# Patient Record
Sex: Male | Born: 1960
Health system: Southern US, Community
[De-identification: ages and names within clinical notes are randomized; demographics above are authoritative.]

## PROBLEM LIST (undated history)

## (undated) DIAGNOSIS — I219 Acute myocardial infarction, unspecified: Secondary | ICD-10-CM

## (undated) DIAGNOSIS — Z87442 Personal history of urinary calculi: Secondary | ICD-10-CM

## (undated) DIAGNOSIS — I255 Ischemic cardiomyopathy: Secondary | ICD-10-CM

## (undated) DIAGNOSIS — E119 Type 2 diabetes mellitus without complications: Secondary | ICD-10-CM

## (undated) DIAGNOSIS — I2510 Atherosclerotic heart disease of native coronary artery without angina pectoris: Secondary | ICD-10-CM

## (undated) DIAGNOSIS — I5022 Chronic systolic (congestive) heart failure: Secondary | ICD-10-CM

## (undated) DIAGNOSIS — E039 Hypothyroidism, unspecified: Secondary | ICD-10-CM

## (undated) DIAGNOSIS — R7989 Other specified abnormal findings of blood chemistry: Secondary | ICD-10-CM

## (undated) DIAGNOSIS — I493 Ventricular premature depolarization: Secondary | ICD-10-CM

## (undated) DIAGNOSIS — R32 Unspecified urinary incontinence: Secondary | ICD-10-CM

## (undated) DIAGNOSIS — I272 Pulmonary hypertension, unspecified: Secondary | ICD-10-CM

## (undated) DIAGNOSIS — I4892 Unspecified atrial flutter: Secondary | ICD-10-CM

## (undated) DIAGNOSIS — Z91148 Patient's other noncompliance with medication regimen for other reason: Secondary | ICD-10-CM

## (undated) DIAGNOSIS — I472 Ventricular tachycardia: Secondary | ICD-10-CM

## (undated) DIAGNOSIS — R945 Abnormal results of liver function studies: Secondary | ICD-10-CM

## (undated) DIAGNOSIS — Z720 Tobacco use: Secondary | ICD-10-CM

## (undated) DIAGNOSIS — I340 Nonrheumatic mitral (valve) insufficiency: Secondary | ICD-10-CM

## (undated) DIAGNOSIS — Z9114 Patient's other noncompliance with medication regimen: Secondary | ICD-10-CM

## (undated) DIAGNOSIS — E785 Hyperlipidemia, unspecified: Secondary | ICD-10-CM

## (undated) DIAGNOSIS — Z955 Presence of coronary angioplasty implant and graft: Secondary | ICD-10-CM

## (undated) DIAGNOSIS — E079 Disorder of thyroid, unspecified: Secondary | ICD-10-CM

## (undated) DIAGNOSIS — I4729 Other ventricular tachycardia: Secondary | ICD-10-CM

## (undated) DIAGNOSIS — M5416 Radiculopathy, lumbar region: Secondary | ICD-10-CM

## (undated) DIAGNOSIS — I10 Essential (primary) hypertension: Secondary | ICD-10-CM

## (undated) DIAGNOSIS — R0683 Snoring: Secondary | ICD-10-CM

## (undated) DIAGNOSIS — F329 Major depressive disorder, single episode, unspecified: Secondary | ICD-10-CM

## (undated) HISTORY — DX: Major depressive disorder, single episode, unspecified: F32.9

## (undated) HISTORY — DX: Unspecified urinary incontinence: R32

## (undated) HISTORY — DX: Hyperlipidemia, unspecified: E78.5

## (undated) HISTORY — PX: ANTERIOR CRUCIATE LIGAMENT REPAIR: SHX115

## (undated) HISTORY — DX: Ventricular premature depolarization: I49.3

## (undated) HISTORY — DX: Radiculopathy, lumbar region: M54.16

## (undated) HISTORY — DX: Ischemic cardiomyopathy: I25.5

## (undated) HISTORY — DX: Essential (primary) hypertension: I10

## (undated) HISTORY — DX: Acute myocardial infarction, unspecified: I21.9

## (undated) HISTORY — DX: Type 2 diabetes mellitus without complications: E11.9

## (undated) HISTORY — DX: Presence of coronary angioplasty implant and graft: Z95.5

## (undated) HISTORY — DX: Hypothyroidism, unspecified: E03.9

## (undated) HISTORY — DX: Atherosclerotic heart disease of native coronary artery without angina pectoris: I25.10

## (undated) HISTORY — DX: Disorder of thyroid, unspecified: E07.9

## (undated) HISTORY — DX: Tobacco use: Z72.0

---

## 1996-02-22 HISTORY — PX: KNEE SURGERY: SHX244

## 2009-02-21 HISTORY — PX: CORONARY STENT PLACEMENT: SHX1402

## 2012-06-26 ENCOUNTER — Encounter: Payer: Self-pay | Admitting: Family Medicine

## 2012-06-26 ENCOUNTER — Ambulatory Visit (INDEPENDENT_AMBULATORY_CARE_PROVIDER_SITE_OTHER): Payer: Worker's Compensation | Admitting: Family Medicine

## 2012-06-26 VITALS — BP 128/82 | HR 80 | Ht 72.0 in | Wt 242.0 lb

## 2012-06-26 DIAGNOSIS — R200 Anesthesia of skin: Secondary | ICD-10-CM

## 2012-06-26 DIAGNOSIS — R32 Unspecified urinary incontinence: Secondary | ICD-10-CM

## 2012-06-26 DIAGNOSIS — M5416 Radiculopathy, lumbar region: Secondary | ICD-10-CM | POA: Insufficient documentation

## 2012-06-26 DIAGNOSIS — IMO0002 Reserved for concepts with insufficient information to code with codable children: Secondary | ICD-10-CM

## 2012-06-26 DIAGNOSIS — Z955 Presence of coronary angioplasty implant and graft: Secondary | ICD-10-CM

## 2012-06-26 DIAGNOSIS — Z8679 Personal history of other diseases of the circulatory system: Secondary | ICD-10-CM | POA: Insufficient documentation

## 2012-06-26 DIAGNOSIS — R209 Unspecified disturbances of skin sensation: Secondary | ICD-10-CM

## 2012-06-26 HISTORY — DX: Presence of coronary angioplasty implant and graft: Z95.5

## 2012-06-26 HISTORY — DX: Radiculopathy, lumbar region: M54.16

## 2012-06-26 HISTORY — DX: Unspecified urinary incontinence: R32

## 2012-06-26 MED ORDER — OXYCODONE-ACETAMINOPHEN 10-325 MG PO TABS: 1.0000 | ORAL_TABLET | Freq: Three times a day (TID) | ORAL | Status: AC | PRN

## 2012-06-26 MED ORDER — PREDNISONE 20 MG PO TABS: ORAL_TABLET | ORAL | Status: AC

## 2012-06-26 MED ORDER — GABAPENTIN 600 MG PO TABS: 600.0000 mg | ORAL_TABLET | Freq: Three times a day (TID) | ORAL | Status: DC
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
## 2012-07-03 ENCOUNTER — Encounter: Payer: Self-pay | Admitting: *Deleted

## 2012-07-05 ENCOUNTER — Ambulatory Visit: Payer: Self-pay | Admitting: Physical Therapy

## 2012-07-10 ENCOUNTER — Ambulatory Visit: Payer: Self-pay | Admitting: Family Medicine

## 2012-07-10 DIAGNOSIS — Z0289 Encounter for other administrative examinations: Secondary | ICD-10-CM

## 2012-07-11 ENCOUNTER — Ambulatory Visit: Payer: Self-pay | Admitting: Physical Therapy

## 2013-05-07 DIAGNOSIS — I2510 Atherosclerotic heart disease of native coronary artery without angina pectoris: Secondary | ICD-10-CM

## 2013-05-07 DIAGNOSIS — I255 Ischemic cardiomyopathy: Secondary | ICD-10-CM | POA: Insufficient documentation

## 2013-05-09 ENCOUNTER — Encounter: Payer: Self-pay | Admitting: Family Medicine

## 2013-06-05 ENCOUNTER — Encounter: Payer: Self-pay | Admitting: Family Medicine

## 2013-06-05 DIAGNOSIS — I255 Ischemic cardiomyopathy: Secondary | ICD-10-CM

## 2013-06-05 HISTORY — DX: Ischemic cardiomyopathy: I25.5

## 2013-06-07 ENCOUNTER — Other Ambulatory Visit: Payer: Self-pay | Admitting: Family Medicine

## 2013-06-07 ENCOUNTER — Encounter: Payer: Self-pay | Admitting: Family Medicine

## 2013-07-10 ENCOUNTER — Encounter: Payer: Self-pay | Admitting: Family Medicine

## 2013-07-10 ENCOUNTER — Ambulatory Visit (INDEPENDENT_AMBULATORY_CARE_PROVIDER_SITE_OTHER): Payer: BC Managed Care – PPO | Admitting: Family Medicine

## 2013-07-10 VITALS — BP 127/77 | HR 85 | Temp 98.3°F | Wt 264.0 lb

## 2013-07-10 DIAGNOSIS — M5416 Radiculopathy, lumbar region: Secondary | ICD-10-CM

## 2013-07-10 DIAGNOSIS — J189 Pneumonia, unspecified organism: Secondary | ICD-10-CM

## 2013-07-10 DIAGNOSIS — IMO0002 Reserved for concepts with insufficient information to code with codable children: Secondary | ICD-10-CM

## 2013-07-10 MED ORDER — LEVOFLOXACIN 500 MG PO TABS: 500.0000 mg | ORAL_TABLET | Freq: Every day | ORAL | Status: DC
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
## 2013-07-22 ENCOUNTER — Telehealth: Payer: Self-pay | Admitting: Family Medicine

## 2013-07-22 NOTE — Telephone Encounter (Signed)
Angie from [facility] called to see why patient has been referred to Neuro since in your notes you stated that patients Workers Comp has denied patient from being a good candidate for Neuro Surgery.

## 2013-07-23 ENCOUNTER — Encounter: Payer: Self-pay | Admitting: Family Medicine

## 2013-07-23 NOTE — Telephone Encounter (Signed)
Patient wants a second opinion and wants to take on the matter using his commercial insurance.

## 2013-07-23 NOTE — Telephone Encounter (Signed)
Left message on Angies vm

## 2013-07-29 ENCOUNTER — Telehealth: Payer: Self-pay | Admitting: Family Medicine

## 2013-07-29 ENCOUNTER — Telehealth: Payer: Self-pay | Admitting: *Deleted

## 2013-07-29 DIAGNOSIS — M5416 Radiculopathy, lumbar region: Secondary | ICD-10-CM

## 2013-07-29 NOTE — Telephone Encounter (Signed)
Pt states he is going to hold off on getting the MRI for right now per the advice of his lawyer

## 2013-07-29 NOTE — Telephone Encounter (Signed)
Sue Lush, Will you please let patient know that [facility] have requested an up to date MRI prior to considering him as a referral.  They want an MRI within the last 12 months so I've ordered one through our system to be obtained at [facility].  Please contact me if not contacted about scheduling this, we'll re-submit the referral once the MRI is available.

## 2013-07-29 NOTE — Telephone Encounter (Signed)
Pa obtained for MRI Lumbar w/o contrast. auth # [id number]. Exp 08/27/13.  Meyer Cory, LPN

## 2014-03-03 ENCOUNTER — Ambulatory Visit (INDEPENDENT_AMBULATORY_CARE_PROVIDER_SITE_OTHER): Payer: BLUE CROSS/BLUE SHIELD | Admitting: Family Medicine

## 2014-03-03 ENCOUNTER — Encounter: Payer: Self-pay | Admitting: Family Medicine

## 2014-03-03 VITALS — BP 131/94 | HR 76 | Wt 257.0 lb

## 2014-03-03 DIAGNOSIS — I255 Ischemic cardiomyopathy: Secondary | ICD-10-CM

## 2014-03-03 DIAGNOSIS — M5416 Radiculopathy, lumbar region: Secondary | ICD-10-CM | POA: Diagnosis not present

## 2014-03-03 DIAGNOSIS — I10 Essential (primary) hypertension: Secondary | ICD-10-CM

## 2014-03-03 MED ORDER — LISINOPRIL 5 MG PO TABS: 5.0000 mg | ORAL_TABLET | Freq: Every day | ORAL | Status: DC

## 2014-03-03 MED ORDER — PRAVASTATIN SODIUM 20 MG PO TABS
20.0000 mg | ORAL_TABLET | Freq: Every day | ORAL | Status: DC
Start: 2014-03-03 — End: 2014-03-12

## 2014-03-03 MED ORDER — CARVEDILOL 12.5 MG PO TABS: 12.5000 mg | ORAL_TABLET | Freq: Two times a day (BID) | ORAL | Status: DC

## 2014-03-03 MED ORDER — TOPIRAMATE 100 MG PO TABS: 100.0000 mg | ORAL_TABLET | Freq: Two times a day (BID) | ORAL | Status: DC

## 2014-03-03 MED ORDER — MELOXICAM 7.5 MG PO TABS: 7.5000 mg | ORAL_TABLET | Freq: Every day | ORAL | Status: DC

## 2014-03-03 MED ORDER — OXYCODONE HCL 10 MG PO TABS: ORAL_TABLET | ORAL | Status: DC

## 2014-03-03 NOTE — Patient Instructions (Signed)
Contact me if you are not contacted within the next 1 or 2 weeks by a cardiology office to establish care within the [facility] system.   I provided you with a taper regimen for oxycodone. I do not prescribe this medication for treatment of chronic pain in this prescription is only to help you get off of the oxycodone without withdrawal symptoms. If you determine that your pain is uncontrolled and you need to go back on oxycodone I will refer you to a pain management clinic.

## 2014-03-03 NOTE — Progress Notes (Signed)
CC: Randall Jackson is a 54 y.o. male is here for Medication Refill   Subjective: HPI:  Follow-up lumbar radiculopathy:he tells me his neurosurgeon has advised against surgery for what sounds like mostly that he is not a good surgical candidate from a cardiac standpoint. He was seen a pain management clinic who has been managing his oxycodone that he tells me slightly helps with the pain however he wants to get off this medication. He tells me he's been able to "survive" from a pain standpoint, on 30 pills since October 2015. Pain is still described as sharp, midline and left low back that radiates down the left leg.  He continues to get benefit from meloxicam and also Topamax and would like to continue these medicines. He denies any new character severity or frequency changes to his pain since I saw him last.  Follow-up essential hypertension: he's been taking the Cipro 5 mg and carvedilol 12.5 mg on a daily basis since I saw him last. No outside blood pressures to report.  There has been no chest pain shortness of breath orthopnea nor peripheral edema since I saw him last. He wants to know if there is a cardiologist within the [facility] system that he can see.   Review Of Systems Outlined In HPI  Past Medical History  Diagnosis Date  . Heart attack     Past Surgical History  Procedure Laterality Date  . Knee surgery  1998  . Cardiac stents    . Stents placed  2011   Family History  Problem Relation Age of Onset  . Diabetes Mother     father    History   Social History  . Marital Status: Married    Spouse Name: N/A    Number of Children: N/A  . Years of Education: N/A   Occupational History  . Not on file.   Social History Main Topics  . Smoking status: Current Every Day Smoker  . Smokeless tobacco: Not on file  . Alcohol Use: 0.5 oz/week    1 drink(s) per week  . Drug Use: No  . Sexual Activity: Not Currently   Other Topics Concern  . Not on file   Social History  Narrative     Objective: BP 131/94 mmHg  Pulse 76  Wt 257 lb (116.574 kg)  General: Alert and Oriented, No Acute Distress HEENT: Pupils equal, round, reactive to light. Conjunctivae clear.  Moist mucous membranes terms unremarkable Lungs: Clear to auscultation bilaterally, no wheezing/ronchi/rales.  Comfortable work of breathing. Good air movement. Cardiac: Regular rate and rhythm. Normal S1/S2.  No murmurs, rubs, nor gallops.   Extremities: No peripheral edema.  Strong peripheral pulses.  Mental Status: No depression, anxiety, nor agitation. Skin: Warm and dry.  Assessment & Plan: Randall Jackson was seen today for medication refill.  Diagnoses and associated orders for this visit:  Lumbar radiculopathy - topiramate (TOPAMAX) 100 MG tablet; Take 1 tablet (100 mg total) by mouth 2 (two) times daily. - meloxicam (MOBIC) 7.5 MG tablet; Take 1 tablet (7.5 mg total) by mouth daily. - Oxycodone HCl 10 MG TABS; Half tab by mouth twice a day for one two weeks, then half tab by mouth daily for two weeks.  Essential hypertension, benign - Lipid panel - Comp Met (CMET)  Cardiomyopathy, ischemic - Ambulatory referral to Cardiology  Other Orders - carvedilol (COREG) 12.5 MG tablet; Take 1 tablet (12.5 mg total) by mouth 2 (two) times daily with a meal. - lisinopril (PRINIVIL,ZESTRIL) 5  MG tablet; Take 1 tablet (5 mg total) by mouth daily. - pravastatin (PRAVACHOL) 20 MG tablet; Take 1 tablet (20 mg total) by mouth daily.    Lumbar radiculopathy: continue Topamax and mobility. Expressed my support of him stopping oxycodone. I provided him with a taper to help minimize any possibility of withdrawal symptoms. I also let him know that this is not a medication that I would be managing chronically and if he feels the need to continue on oxycodone or any other narcotic I would refer him back to pain management. Essential hypertension: he is just barely in stage I hypertension and I believe  Blood  pressure would improve withfurther sodium restriction therefore no changes to his current carvedilol and the Cipro regimen Ischemic cardiomyopathy: Continue on pravastatin, referral to cardiology has been placed    Return if symptoms worsen or fail to improve.

## 2014-03-11 LAB — COMPREHENSIVE METABOLIC PANEL
ALK PHOS: 62 U/L (ref 39–117)
ALT: 23 U/L (ref 0–53)
AST: 18 U/L (ref 0–37)
Albumin: 4 g/dL (ref 3.5–5.2)
BUN: 14 mg/dL (ref 6–23)
CALCIUM: 9.1 mg/dL (ref 8.4–10.5)
CO2: 22 meq/L (ref 19–32)
CREATININE: 0.78 mg/dL (ref 0.50–1.35)
Chloride: 104 mEq/L (ref 96–112)
Glucose, Bld: 104 mg/dL — ABNORMAL HIGH (ref 70–99)
Potassium: 4.3 mEq/L (ref 3.5–5.3)
Sodium: 135 mEq/L (ref 135–145)
Total Bilirubin: 0.6 mg/dL (ref 0.2–1.2)
Total Protein: 6.8 g/dL (ref 6.0–8.3)

## 2014-03-11 LAB — LIPID PANEL
CHOL/HDL RATIO: 6.8 ratio
Cholesterol: 176 mg/dL (ref 0–200)
HDL: 26 mg/dL — AB (ref >39)
LDL Cholesterol: 96 mg/dL (ref 0–99)
Triglycerides: 268 mg/dL — ABNORMAL HIGH (ref <150)
VLDL: 54 mg/dL — AB (ref 0–40)

## 2014-03-12 ENCOUNTER — Other Ambulatory Visit: Payer: Self-pay | Admitting: Family Medicine

## 2014-03-12 ENCOUNTER — Telehealth: Payer: Self-pay | Admitting: Family Medicine

## 2014-03-12 MED ORDER — PRAVASTATIN SODIUM 40 MG PO TABS: 40.0000 mg | ORAL_TABLET | Freq: Every day | ORAL | Status: DC

## 2014-03-12 MED ORDER — FISH OIL 1000 MG PO CAPS: ORAL_CAPSULE | ORAL | Status: DC

## 2014-03-12 NOTE — Telephone Encounter (Signed)
Randall Jackson, Will you please let patient know that kidney and liver function is normal.  His LDL cholesterol and triglycerides are above goal for protecting his cardiovascular health therefore I'd recommend increasing his pravastatin to 40mg  daily and adding two OTC fish oil capsules, 1g each, to breakfast and dinner.  We'll want to recheck this in 3 months.  I've sent a new Rx to his [facility]

## 2014-03-12 NOTE — Telephone Encounter (Signed)
Spoke to patient gave him lab results as noted below. Chanel Mckesson,CMA

## 2014-04-09 ENCOUNTER — Institutional Professional Consult (permissible substitution): Payer: Self-pay | Admitting: Cardiology

## 2014-04-22 ENCOUNTER — Encounter: Payer: Self-pay | Admitting: Family Medicine

## 2014-04-22 ENCOUNTER — Ambulatory Visit (INDEPENDENT_AMBULATORY_CARE_PROVIDER_SITE_OTHER): Payer: BLUE CROSS/BLUE SHIELD | Admitting: Family Medicine

## 2014-04-22 VITALS — BP 131/89 | HR 78 | Wt 256.0 lb

## 2014-04-22 DIAGNOSIS — M5416 Radiculopathy, lumbar region: Secondary | ICD-10-CM

## 2014-04-22 DIAGNOSIS — F329 Major depressive disorder, single episode, unspecified: Secondary | ICD-10-CM

## 2014-04-22 DIAGNOSIS — F32A Depression, unspecified: Secondary | ICD-10-CM

## 2014-04-22 MED ORDER — DULOXETINE HCL 20 MG PO CPEP: 20.0000 mg | ORAL_CAPSULE | Freq: Every day | ORAL | Status: DC
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
## 2014-08-05 ENCOUNTER — Other Ambulatory Visit: Payer: Self-pay | Admitting: Family Medicine

## 2014-08-05 DIAGNOSIS — M5416 Radiculopathy, lumbar region: Secondary | ICD-10-CM

## 2014-08-05 MED ORDER — TOPIRAMATE 100 MG PO TABS: 100.0000 mg | ORAL_TABLET | Freq: Two times a day (BID) | ORAL | Status: DC

## 2014-08-05 NOTE — Telephone Encounter (Signed)
Rx sent to [facility] on [address].

## 2014-08-06 ENCOUNTER — Telehealth: Payer: Self-pay | Admitting: *Deleted

## 2014-08-06 NOTE — Telephone Encounter (Signed)
Pt called today questioning why he is no longer "allowed" to take the meloxicam.  I read the notes to him from the 3-1 visit where it's stated that it was ineffective.  He then stated that he's been taking it in conjunction with the cymbalta and it's been working for the most part.  He stated that he's still in a lot of pain and sometimes still wakes up in tears but he doesn't want to go down the narcotic road.  Plz advise.

## 2014-08-07 MED ORDER — MELOXICAM 15 MG PO TABS: 15.0000 mg | ORAL_TABLET | Freq: Every day | ORAL | Status: DC

## 2014-08-07 NOTE — Telephone Encounter (Signed)
Pt notified of rx. 

## 2014-08-07 NOTE — Telephone Encounter (Signed)
Amber, Can you please let patient know that I never intended for him to not be allowed to take meloxicam, he's more than welcome to take this medication.  I've sent refills to [facility] on [address].

## 2015-01-14 ENCOUNTER — Other Ambulatory Visit: Payer: Self-pay

## 2015-01-14 MED ORDER — PRAVASTATIN SODIUM 40 MG PO TABS: 40.0000 mg | ORAL_TABLET | Freq: Every day | ORAL | Status: DC

## 2015-01-14 NOTE — Telephone Encounter (Signed)
Pt is requesting a refill on pravastatin but has not been seen since March.

## 2015-01-19 ENCOUNTER — Other Ambulatory Visit: Payer: Self-pay

## 2015-01-19 DIAGNOSIS — F329 Major depressive disorder, single episode, unspecified: Secondary | ICD-10-CM

## 2015-01-19 DIAGNOSIS — F32A Depression, unspecified: Secondary | ICD-10-CM

## 2015-01-19 DIAGNOSIS — M5416 Radiculopathy, lumbar region: Secondary | ICD-10-CM

## 2015-01-19 MED ORDER — TOPIRAMATE 100 MG PO TABS: 100.0000 mg | ORAL_TABLET | Freq: Two times a day (BID) | ORAL | Status: DC

## 2015-01-19 MED ORDER — DULOXETINE HCL 20 MG PO CPEP: 20.0000 mg | ORAL_CAPSULE | Freq: Every day | ORAL | Status: DC

## 2015-02-13 ENCOUNTER — Encounter: Payer: Self-pay | Admitting: Family Medicine

## 2015-02-13 ENCOUNTER — Ambulatory Visit (INDEPENDENT_AMBULATORY_CARE_PROVIDER_SITE_OTHER): Payer: BLUE CROSS/BLUE SHIELD | Admitting: Family Medicine

## 2015-02-13 VITALS — BP 116/78 | HR 72 | Wt 250.0 lb

## 2015-02-13 DIAGNOSIS — F32A Depression, unspecified: Secondary | ICD-10-CM | POA: Insufficient documentation

## 2015-02-13 DIAGNOSIS — Z1211 Encounter for screening for malignant neoplasm of colon: Secondary | ICD-10-CM

## 2015-02-13 DIAGNOSIS — I10 Essential (primary) hypertension: Secondary | ICD-10-CM

## 2015-02-13 DIAGNOSIS — M5416 Radiculopathy, lumbar region: Secondary | ICD-10-CM

## 2015-02-13 DIAGNOSIS — F329 Major depressive disorder, single episode, unspecified: Secondary | ICD-10-CM

## 2015-02-13 DIAGNOSIS — E785 Hyperlipidemia, unspecified: Secondary | ICD-10-CM

## 2015-02-13 DIAGNOSIS — Z125 Encounter for screening for malignant neoplasm of prostate: Secondary | ICD-10-CM

## 2015-02-13 HISTORY — DX: Depression, unspecified: F32.A

## 2015-02-13 LAB — LIPID PANEL
CHOL/HDL RATIO: 6.9 ratio — AB (ref <=5.0)
Cholesterol: 185 mg/dL (ref 125–200)
HDL: 27 mg/dL — ABNORMAL LOW (ref >=40)
LDL Cholesterol: 106 mg/dL (ref <130)
Triglycerides: 258 mg/dL — ABNORMAL HIGH (ref <150)
VLDL: 52 mg/dL — ABNORMAL HIGH (ref <30)

## 2015-02-13 MED ORDER — LISINOPRIL 5 MG PO TABS: 5.0000 mg | ORAL_TABLET | Freq: Every day | ORAL | Status: DC

## 2015-02-13 MED ORDER — TOPIRAMATE 100 MG PO TABS: 100.0000 mg | ORAL_TABLET | Freq: Two times a day (BID) | ORAL | Status: DC

## 2015-02-13 MED ORDER — CARVEDILOL 12.5 MG PO TABS: 12.5000 mg | ORAL_TABLET | Freq: Two times a day (BID) | ORAL | Status: DC

## 2015-02-13 MED ORDER — PRAVASTATIN SODIUM 40 MG PO TABS: 40.0000 mg | ORAL_TABLET | Freq: Every day | ORAL | Status: DC

## 2015-02-13 NOTE — Progress Notes (Signed)
CC: Randall Jackson is a 54 y.o. male is here for Medication Refill   Subjective: HPI:  Follow-up lumbar radiculopathy: He decided to stop taking meloxicam and all other nonsteroidal anti-inflammatories because he was scared that it could cause him to have a heart attack. He began taking co-enzyme Q10 and this other unknown herbal supplement he got at [employer] and it's drastically relieved his back pain. He occasionally gets a shooting SENSATION down the right leg but it's infrequent.  Follow-up depression: He decided to stop taking duloxetine to see if he still needed to be taking it. For a few months now he's been feeling "great" on a daily basis. He is getting out more often and has a job that does not require any heavy lifting. He is happy with his current quality of life.  He ran out of pravastatin  And he wants to know if he should still be taking this. He denies any known side effects.  Follow-up essential hypertension: Continues to take carvedilol on daily basis along with lisinopril. No outside blood pressures report. He denies any chest pain or limb claudication    Review Of Systems Outlined In HPI  Past Medical History  Diagnosis Date  . Heart attack [facility])     Past Surgical History  Procedure Laterality Date  . Knee surgery  1998  . Cardiac stents    . Stents placed  2011   Family History  Problem Relation Age of Onset  . Diabetes Mother     father    Social History   Social History  . Marital Status: Married    Spouse Name: N/A  . Number of Children: N/A  . Years of Education: N/A   Occupational History  . Not on file.   Social History Main Topics  . Smoking status: Current Every Day Smoker  . Smokeless tobacco: Not on file  . Alcohol Use: 0.5 oz/week    1 drink(s) per week  . Drug Use: No  . Sexual Activity: Not Currently   Other Topics Concern  . Not on file   Social History Narrative     Objective: BP 116/78 mmHg  Pulse 72  Wt 250 lb (113.399  kg)  Vital signs reviewed. General: Alert and Oriented, No Acute Distress HEENT: Pupils equal, round, reactive to light. Conjunctivae clear.  External ears unremarkable.  Moist mucous membranes. Lungs: Clear and comfortable work of breathing, speaking in full sentences without accessory muscle use. Cardiac: Regular rate and rhythm.  Neuro: CN II-XII grossly intact, gait normal. Extremities: No peripheral edema.  Strong peripheral pulses.  Mental Status: No depression, anxiety, nor agitation. Logical though process. Skin: Warm and dry.  Assessment & Plan: Randall Jackson was seen today for medication refill.  Diagnoses and all orders for this visit:  Depression  Lumbar radiculopathy -     topiramate (TOPAMAX) 100 MG tablet; Take 1 tablet (100 mg total) by mouth 2 (two) times daily.  Hyperlipidemia -     pravastatin (PRAVACHOL) 40 MG tablet; Take 1 tablet (40 mg total) by mouth daily. -     Lipid panel  Essential hypertension -     carvedilol (COREG) 12.5 MG tablet; Take 1 tablet (12.5 mg total) by mouth 2 (two) times daily with a meal. -     lisinopril (PRINIVIL,ZESTRIL) 5 MG tablet; Take 1 tablet (5 mg total) by mouth daily.  Screening for prostate cancer -     PSA  Colon cancer screening -     Ambulatory  referral to Gastroenterology   Depression: Resolved Lumbar radiculopathy: He still believes Topamax is helping so refills have been provided. Hyperlipidemia: Stressed the importance of taking pravastatin given his cardiac history Essential hypertension: Controlled with carvedilol and lisinopril He's never had a PSA drawn since he turned 51, will obtain today for cancer screening He's also never had a colonoscopy, referrals were placed for this as well  25 minutes spent face-to-face during visit today of which at least 50% was counseling or coordinating care regarding: 1. Depression   2. Lumbar radiculopathy   3. Hyperlipidemia   4. Essential hypertension   5. Screening for  prostate cancer   6. Colon cancer screening       Return in about 3 months (around 05/14/2015).

## 2015-02-14 LAB — PSA: PSA: 1.73 ng/mL (ref <=4.00)

## 2015-02-24 ENCOUNTER — Telehealth: Payer: Self-pay

## 2015-02-24 NOTE — Telephone Encounter (Signed)
-----   Message from Agapito Games, MD sent at 02/20/2015  9:14 AM EST ----- Call patient: Prostate level is normal. Total cholesterol and LDL not quite to goal. Triglycerides are elevated and the good cholesterol which is the HDL is low. Make sure getting regular exercise for at least 30 minutes 5 days per week in addition to a healthy low-fat lean diet. I would recommend consideration of changing his pravastatin to atorvastatin which is more potent. And warm or effectively lower his cholesterol. If he is okay with the change and please let me know and we'll send a new prescription for atorvastatin 40 mg 1 a day at bedtime #90 with 3 refills and then recheck his cholesterol in 6 months.

## 2015-02-24 NOTE — Telephone Encounter (Signed)
Patient aware of lab results and recommendations.  He is not interested in changing cholesterol medications yet.  He will try diet and exercise and follow up in 6 months.

## 2015-09-28 ENCOUNTER — Ambulatory Visit (INDEPENDENT_AMBULATORY_CARE_PROVIDER_SITE_OTHER): Payer: BLUE CROSS/BLUE SHIELD | Admitting: Osteopathic Medicine

## 2015-09-28 ENCOUNTER — Encounter: Payer: Self-pay | Admitting: Osteopathic Medicine

## 2015-09-28 VITALS — BP 139/90 | HR 66 | Wt 256.0 lb

## 2015-09-28 DIAGNOSIS — L237 Allergic contact dermatitis due to plants, except food: Secondary | ICD-10-CM

## 2015-09-28 MED ORDER — METHYLPREDNISOLONE SODIUM SUCC 125 MG IJ SOLR
125.0000 mg | Freq: Once | INTRAMUSCULAR | Status: AC
  Administered 2015-09-28: 125 mg via INTRAMUSCULAR

## 2015-09-28 NOTE — Progress Notes (Signed)
HPI: Randall Jackson is a 55 y.o. male  who presents to [name] today, 09/28/15,  for chief complaint of:  Chief Complaint  Patient presents with  . Poison Ivy     . Context: Exposure to poison ivy over the weekend . Location: Arms and legs . Quality: Very itchy, rashes bothering him and seems to be spreading     Past medical, surgical, social and family history reviewed: Past Medical History:  Diagnosis Date  . Heart attack [facility])    Past Surgical History:  Procedure Laterality Date  . cardiac stents    . KNEE SURGERY  1998  . stents placed  2011   Social History  Substance Use Topics  . Smoking status: Current Every Day Smoker  . Smokeless tobacco: Not on file  . Alcohol use 0.5 oz/week    1 drink(s) per week   Family History  Problem Relation Age of Onset  . Diabetes Mother     father     Current medication list and allergy/intolerance information reviewed:   Current Outpatient Prescriptions  Medication Sig Dispense Refill  . carvedilol (COREG) 12.5 MG tablet Take 1 tablet (12.5 mg total) by mouth 2 (two) times daily with a meal. 60 tablet 3  . lisinopril (PRINIVIL,ZESTRIL) 5 MG tablet Take 1 tablet (5 mg total) by mouth daily. 90 tablet 3  . pravastatin (PRAVACHOL) 40 MG tablet Take 1 tablet (40 mg total) by mouth daily. 90 tablet 1  . topiramate (TOPAMAX) 100 MG tablet Take 1 tablet (100 mg total) by mouth 2 (two) times daily. 60 tablet 0   No current facility-administered medications for this visit.    Allergies  Allergen Reactions  . Codeine   . Gabapentin     Cognitive issues      Review of Systems:  Constitutional:  No  fever, no chills,  Cardiac: No  chest pain,  Skin: (+)Rash, No other wounds/concerning lesions  Exam:  BP 139/90   Pulse 66   Wt 256 lb (116.1 kg)   BMI 34.72 kg/m   Constitutional: VS see above. General Appearance: alert, well-developed, well-nourished, NAD  Skin: Positive  erythematous/mild blistering rash on arms/legs consistent with poison ivy contact dermatitis, skin is otherwise warm, dry, intact. No rash/ulcer. No concerning nevi or subq nodules on limited exam.      ASSESSMENT/PLAN:   Steroid shot in the office today, consider oral taper if no improvement, patient given list of over-the-counter itching remedies if rash continues to bother him. Patient advised on lung parents/long sleeves and working outside, thorough washing afterwards if any possibility of exposure to irritant  Poison ivy dermatitis - Plan: methylPREDNISolone sodium succinate (SOLU-MEDROL) 125 mg/2 mL injection 125 mg     Visit summary with medication list and pertinent instructions was printed for patient to review. All questions at time of visit were answered - patient instructed to contact office with any additional concerns. ER/RTC precautions were reviewed with the patient. Follow-up plan: Return if symptoms worsen or fail to improve, and as directed for routine care/medication management.

## 2015-09-28 NOTE — Patient Instructions (Signed)
If itching is bad at home - can try Hydrocortisone or Benadryl creams, Calamine lotion, oral antihistamines.

## 2015-09-30 ENCOUNTER — Telehealth: Payer: Self-pay

## 2015-09-30 MED ORDER — PREDNISONE 10 MG (48) PO TBPK: ORAL_TABLET | Freq: Every day | ORAL | 0 refills | Status: DC

## 2015-09-30 NOTE — Telephone Encounter (Signed)
Randall Jackson called and states the poison ivy is worse. He would like the prednisone pack as offered. Please advise. [facility].

## 2015-09-30 NOTE — Telephone Encounter (Signed)
Okay, sent.

## 2018-01-19 DIAGNOSIS — I5043 Acute on chronic combined systolic (congestive) and diastolic (congestive) heart failure: Secondary | ICD-10-CM | POA: Diagnosis not present

## 2018-01-19 DIAGNOSIS — I5022 Chronic systolic (congestive) heart failure: Secondary | ICD-10-CM | POA: Diagnosis not present

## 2018-01-19 DIAGNOSIS — Y838 Other surgical procedures as the cause of abnormal reaction of the patient, or of later complication, without mention of misadventure at the time of the procedure: Secondary | ICD-10-CM | POA: Diagnosis not present

## 2018-01-19 DIAGNOSIS — I255 Ischemic cardiomyopathy: Secondary | ICD-10-CM | POA: Diagnosis not present

## 2018-01-19 DIAGNOSIS — R945 Abnormal results of liver function studies: Secondary | ICD-10-CM | POA: Diagnosis not present

## 2018-01-19 DIAGNOSIS — J90 Pleural effusion, not elsewhere classified: Secondary | ICD-10-CM | POA: Diagnosis not present

## 2018-01-19 DIAGNOSIS — J9811 Atelectasis: Secondary | ICD-10-CM | POA: Diagnosis not present

## 2018-01-19 DIAGNOSIS — I5033 Acute on chronic diastolic (congestive) heart failure: Secondary | ICD-10-CM | POA: Diagnosis not present

## 2018-01-19 DIAGNOSIS — I5023 Acute on chronic systolic (congestive) heart failure: Secondary | ICD-10-CM | POA: Diagnosis not present

## 2018-01-19 DIAGNOSIS — R55 Syncope and collapse: Secondary | ICD-10-CM | POA: Diagnosis not present

## 2018-01-19 DIAGNOSIS — T82855A Stenosis of coronary artery stent, initial encounter: Secondary | ICD-10-CM | POA: Diagnosis not present

## 2018-01-19 DIAGNOSIS — I2582 Chronic total occlusion of coronary artery: Secondary | ICD-10-CM | POA: Diagnosis not present

## 2018-01-19 DIAGNOSIS — I252 Old myocardial infarction: Secondary | ICD-10-CM | POA: Diagnosis not present

## 2018-01-19 DIAGNOSIS — Z9114 Patient's other noncompliance with medication regimen: Secondary | ICD-10-CM | POA: Diagnosis not present

## 2018-01-19 DIAGNOSIS — Z87891 Personal history of nicotine dependence: Secondary | ICD-10-CM | POA: Diagnosis not present

## 2018-01-19 DIAGNOSIS — I081 Rheumatic disorders of both mitral and tricuspid valves: Secondary | ICD-10-CM | POA: Diagnosis not present

## 2018-01-19 DIAGNOSIS — I509 Heart failure, unspecified: Secondary | ICD-10-CM | POA: Diagnosis not present

## 2018-01-19 DIAGNOSIS — I2510 Atherosclerotic heart disease of native coronary artery without angina pectoris: Secondary | ICD-10-CM | POA: Diagnosis not present

## 2018-01-19 DIAGNOSIS — F1290 Cannabis use, unspecified, uncomplicated: Secondary | ICD-10-CM | POA: Diagnosis not present

## 2018-01-19 DIAGNOSIS — Z885 Allergy status to narcotic agent status: Secondary | ICD-10-CM | POA: Diagnosis not present

## 2018-01-19 DIAGNOSIS — I517 Cardiomegaly: Secondary | ICD-10-CM | POA: Diagnosis not present

## 2018-01-19 DIAGNOSIS — R0602 Shortness of breath: Secondary | ICD-10-CM | POA: Diagnosis not present

## 2018-01-19 DIAGNOSIS — R7989 Other specified abnormal findings of blood chemistry: Secondary | ICD-10-CM | POA: Diagnosis not present

## 2018-01-19 DIAGNOSIS — R079 Chest pain, unspecified: Secondary | ICD-10-CM | POA: Diagnosis not present

## 2018-01-19 DIAGNOSIS — F329 Major depressive disorder, single episode, unspecified: Secondary | ICD-10-CM | POA: Diagnosis not present

## 2018-01-19 DIAGNOSIS — E785 Hyperlipidemia, unspecified: Secondary | ICD-10-CM | POA: Diagnosis not present

## 2018-01-19 DIAGNOSIS — R918 Other nonspecific abnormal finding of lung field: Secondary | ICD-10-CM | POA: Diagnosis not present

## 2018-01-19 DIAGNOSIS — Q256 Stenosis of pulmonary artery: Secondary | ICD-10-CM | POA: Diagnosis not present

## 2018-01-19 DIAGNOSIS — F419 Anxiety disorder, unspecified: Secondary | ICD-10-CM | POA: Diagnosis not present

## 2018-01-19 DIAGNOSIS — I110 Hypertensive heart disease with heart failure: Secondary | ICD-10-CM | POA: Diagnosis not present

## 2018-01-20 DIAGNOSIS — I10 Essential (primary) hypertension: Secondary | ICD-10-CM | POA: Insufficient documentation

## 2018-01-20 DIAGNOSIS — R55 Syncope and collapse: Secondary | ICD-10-CM | POA: Insufficient documentation

## 2018-02-22 DIAGNOSIS — I10 Essential (primary) hypertension: Secondary | ICD-10-CM | POA: Diagnosis not present

## 2018-02-22 DIAGNOSIS — I2510 Atherosclerotic heart disease of native coronary artery without angina pectoris: Secondary | ICD-10-CM | POA: Diagnosis not present

## 2018-02-22 DIAGNOSIS — I2111 ST elevation (STEMI) myocardial infarction involving right coronary artery: Secondary | ICD-10-CM | POA: Diagnosis not present

## 2018-02-22 DIAGNOSIS — R5383 Other fatigue: Secondary | ICD-10-CM | POA: Diagnosis not present

## 2018-02-22 DIAGNOSIS — I5022 Chronic systolic (congestive) heart failure: Secondary | ICD-10-CM | POA: Diagnosis not present

## 2018-03-29 ENCOUNTER — Telehealth: Payer: Self-pay

## 2018-03-29 NOTE — Telephone Encounter (Signed)
Sent referral to scheduling and filed notes 

## 2018-04-22 ENCOUNTER — Inpatient Hospital Stay (HOSPITAL_COMMUNITY)
Admission: EM | Admit: 2018-04-22 | Discharge: 2018-04-28 | DRG: 287 | Disposition: A | Discharge status: Home / Self Care | Payer: BLUE CROSS/BLUE SHIELD | Attending: Cardiology | Admitting: Cardiology

## 2018-04-22 ENCOUNTER — Other Ambulatory Visit: Payer: Self-pay

## 2018-04-22 ENCOUNTER — Encounter (HOSPITAL_COMMUNITY): Payer: Self-pay | Admitting: Emergency Medicine

## 2018-04-22 ENCOUNTER — Emergency Department (HOSPITAL_COMMUNITY): Payer: BLUE CROSS/BLUE SHIELD

## 2018-04-22 DIAGNOSIS — Z7189 Other specified counseling: Secondary | ICD-10-CM | POA: Diagnosis not present

## 2018-04-22 DIAGNOSIS — Z885 Allergy status to narcotic agent status: Secondary | ICD-10-CM

## 2018-04-22 DIAGNOSIS — Z515 Encounter for palliative care: Secondary | ICD-10-CM | POA: Diagnosis not present

## 2018-04-22 DIAGNOSIS — I252 Old myocardial infarction: Secondary | ICD-10-CM | POA: Diagnosis not present

## 2018-04-22 DIAGNOSIS — I255 Ischemic cardiomyopathy: Secondary | ICD-10-CM | POA: Diagnosis present

## 2018-04-22 DIAGNOSIS — F419 Anxiety disorder, unspecified: Secondary | ICD-10-CM | POA: Diagnosis not present

## 2018-04-22 DIAGNOSIS — I509 Heart failure, unspecified: Secondary | ICD-10-CM

## 2018-04-22 DIAGNOSIS — E875 Hyperkalemia: Secondary | ICD-10-CM | POA: Diagnosis present

## 2018-04-22 DIAGNOSIS — N179 Acute kidney failure, unspecified: Secondary | ICD-10-CM | POA: Diagnosis present

## 2018-04-22 DIAGNOSIS — I2510 Atherosclerotic heart disease of native coronary artery without angina pectoris: Secondary | ICD-10-CM | POA: Diagnosis present

## 2018-04-22 DIAGNOSIS — Z955 Presence of coronary angioplasty implant and graft: Secondary | ICD-10-CM

## 2018-04-22 DIAGNOSIS — R739 Hyperglycemia, unspecified: Secondary | ICD-10-CM

## 2018-04-22 DIAGNOSIS — E669 Obesity, unspecified: Secondary | ICD-10-CM | POA: Diagnosis present

## 2018-04-22 DIAGNOSIS — I4891 Unspecified atrial fibrillation: Secondary | ICD-10-CM | POA: Diagnosis not present

## 2018-04-22 DIAGNOSIS — R0683 Snoring: Secondary | ICD-10-CM | POA: Diagnosis present

## 2018-04-22 DIAGNOSIS — R946 Abnormal results of thyroid function studies: Secondary | ICD-10-CM | POA: Diagnosis not present

## 2018-04-22 DIAGNOSIS — I5043 Acute on chronic combined systolic (congestive) and diastolic (congestive) heart failure: Secondary | ICD-10-CM | POA: Diagnosis not present

## 2018-04-22 DIAGNOSIS — Z888 Allergy status to other drugs, medicaments and biological substances status: Secondary | ICD-10-CM

## 2018-04-22 DIAGNOSIS — R079 Chest pain, unspecified: Secondary | ICD-10-CM | POA: Diagnosis not present

## 2018-04-22 DIAGNOSIS — I420 Dilated cardiomyopathy: Secondary | ICD-10-CM | POA: Diagnosis not present

## 2018-04-22 DIAGNOSIS — Z6835 Body mass index (BMI) 35.0-35.9, adult: Secondary | ICD-10-CM

## 2018-04-22 DIAGNOSIS — I361 Nonrheumatic tricuspid (valve) insufficiency: Secondary | ICD-10-CM | POA: Diagnosis not present

## 2018-04-22 DIAGNOSIS — I2582 Chronic total occlusion of coronary artery: Secondary | ICD-10-CM | POA: Diagnosis present

## 2018-04-22 DIAGNOSIS — I081 Rheumatic disorders of both mitral and tricuspid valves: Secondary | ICD-10-CM | POA: Diagnosis present

## 2018-04-22 DIAGNOSIS — R000 Tachycardia, unspecified: Secondary | ICD-10-CM | POA: Diagnosis not present

## 2018-04-22 DIAGNOSIS — I4892 Unspecified atrial flutter: Secondary | ICD-10-CM

## 2018-04-22 DIAGNOSIS — E7800 Pure hypercholesterolemia, unspecified: Secondary | ICD-10-CM | POA: Diagnosis not present

## 2018-04-22 DIAGNOSIS — F329 Major depressive disorder, single episode, unspecified: Secondary | ICD-10-CM | POA: Diagnosis present

## 2018-04-22 DIAGNOSIS — Z9119 Patient's noncompliance with other medical treatment and regimen: Secondary | ICD-10-CM

## 2018-04-22 DIAGNOSIS — E1165 Type 2 diabetes mellitus with hyperglycemia: Secondary | ICD-10-CM | POA: Diagnosis present

## 2018-04-22 DIAGNOSIS — I472 Ventricular tachycardia, unspecified: Secondary | ICD-10-CM

## 2018-04-22 DIAGNOSIS — I429 Cardiomyopathy, unspecified: Secondary | ICD-10-CM | POA: Diagnosis not present

## 2018-04-22 DIAGNOSIS — Z833 Family history of diabetes mellitus: Secondary | ICD-10-CM

## 2018-04-22 DIAGNOSIS — R0602 Shortness of breath: Secondary | ICD-10-CM | POA: Diagnosis not present

## 2018-04-22 DIAGNOSIS — R0902 Hypoxemia: Secondary | ICD-10-CM | POA: Diagnosis present

## 2018-04-22 DIAGNOSIS — I110 Hypertensive heart disease with heart failure: Principal | ICD-10-CM | POA: Diagnosis present

## 2018-04-22 DIAGNOSIS — I5023 Acute on chronic systolic (congestive) heart failure: Secondary | ICD-10-CM | POA: Insufficient documentation

## 2018-04-22 DIAGNOSIS — R7989 Other specified abnormal findings of blood chemistry: Secondary | ICD-10-CM

## 2018-04-22 DIAGNOSIS — Z7982 Long term (current) use of aspirin: Secondary | ICD-10-CM | POA: Diagnosis not present

## 2018-04-22 DIAGNOSIS — I483 Typical atrial flutter: Secondary | ICD-10-CM | POA: Diagnosis not present

## 2018-04-22 DIAGNOSIS — I248 Other forms of acute ischemic heart disease: Secondary | ICD-10-CM | POA: Diagnosis not present

## 2018-04-22 DIAGNOSIS — Z87891 Personal history of nicotine dependence: Secondary | ICD-10-CM

## 2018-04-22 DIAGNOSIS — I272 Pulmonary hypertension, unspecified: Secondary | ICD-10-CM | POA: Diagnosis not present

## 2018-04-22 DIAGNOSIS — I4729 Other ventricular tachycardia: Secondary | ICD-10-CM

## 2018-04-22 DIAGNOSIS — I340 Nonrheumatic mitral (valve) insufficiency: Secondary | ICD-10-CM | POA: Diagnosis not present

## 2018-04-22 DIAGNOSIS — Z79899 Other long term (current) drug therapy: Secondary | ICD-10-CM | POA: Diagnosis not present

## 2018-04-22 DIAGNOSIS — E785 Hyperlipidemia, unspecified: Secondary | ICD-10-CM

## 2018-04-22 DIAGNOSIS — Z9114 Patient's other noncompliance with medication regimen: Secondary | ICD-10-CM | POA: Diagnosis not present

## 2018-04-22 HISTORY — DX: Unspecified atrial flutter: I48.92

## 2018-04-22 HISTORY — DX: Patient's other noncompliance with medication regimen: Z91.14

## 2018-04-22 HISTORY — DX: Abnormal results of liver function studies: R94.5

## 2018-04-22 HISTORY — DX: Other specified abnormal findings of blood chemistry: R79.89

## 2018-04-22 HISTORY — DX: Patient's other noncompliance with medication regimen for other reason: Z91.148

## 2018-04-22 HISTORY — DX: Pulmonary hypertension, unspecified: I27.20

## 2018-04-22 HISTORY — DX: Personal history of urinary calculi: Z87.442

## 2018-04-22 HISTORY — DX: Ventricular tachycardia: I47.2

## 2018-04-22 HISTORY — DX: Snoring: R06.83

## 2018-04-22 HISTORY — DX: Other ventricular tachycardia: I47.29

## 2018-04-22 HISTORY — DX: Nonrheumatic mitral (valve) insufficiency: I34.0

## 2018-04-22 HISTORY — DX: Chronic systolic (congestive) heart failure: I50.22

## 2018-04-22 LAB — BASIC METABOLIC PANEL
Anion gap: 11 (ref 5–15)
BUN: 28 mg/dL — ABNORMAL HIGH (ref 6–20)
CHLORIDE: 104 mmol/L (ref 98–111)
CO2: 18 mmol/L — ABNORMAL LOW (ref 22–32)
Calcium: 8.4 mg/dL — ABNORMAL LOW (ref 8.9–10.3)
Creatinine, Ser: 1.38 mg/dL — ABNORMAL HIGH (ref 0.61–1.24)
GFR calc Af Amer: >60 mL/min (ref >60)
GFR calc non Af Amer: 57 mL/min — ABNORMAL LOW (ref >60)
Glucose, Bld: 140 mg/dL — ABNORMAL HIGH (ref 70–99)
Potassium: 6.5 mmol/L (ref 3.5–5.1)
SODIUM: 133 mmol/L — AB (ref 135–145)

## 2018-04-22 LAB — CBC WITH DIFFERENTIAL/PLATELET
Abs Immature Granulocytes: 0.06 10*3/uL (ref 0.00–0.07)
Basophils Absolute: 0.1 10*3/uL (ref 0.0–0.1)
Basophils Relative: 1 %
Eosinophils Absolute: 0.2 10*3/uL (ref 0.0–0.5)
Eosinophils Relative: 2 %
HCT: 57.1 % — ABNORMAL HIGH (ref 39.0–52.0)
HEMOGLOBIN: 18.7 g/dL — AB (ref 13.0–17.0)
Immature Granulocytes: 0 %
Lymphocytes Relative: 24 %
Lymphs Abs: 3.2 10*3/uL (ref 0.7–4.0)
MCH: 30.2 pg (ref 26.0–34.0)
MCHC: 32.7 g/dL (ref 30.0–36.0)
MCV: 92.2 fL (ref 80.0–100.0)
Monocytes Absolute: 2 10*3/uL — ABNORMAL HIGH (ref 0.1–1.0)
Monocytes Relative: 15 %
Neutro Abs: 8 10*3/uL — ABNORMAL HIGH (ref 1.7–7.7)
Neutrophils Relative %: 58 %
Platelets: 288 10*3/uL (ref 150–400)
RBC: 6.19 MIL/uL — ABNORMAL HIGH (ref 4.22–5.81)
RDW: 14.9 % (ref 11.5–15.5)
WBC: 13.6 10*3/uL — AB (ref 4.0–10.5)
nRBC: 0 % (ref 0.0–0.2)

## 2018-04-22 LAB — BRAIN NATRIURETIC PEPTIDE: B Natriuretic Peptide: 954.3 pg/mL — ABNORMAL HIGH (ref 0.0–100.0)

## 2018-04-22 LAB — LIPID PANEL
Cholesterol: 112 mg/dL (ref 0–200)
HDL: 23 mg/dL — ABNORMAL LOW (ref >40)
LDL Cholesterol: 71 mg/dL (ref 0–99)
Total CHOL/HDL Ratio: 4.9 RATIO
Triglycerides: 90 mg/dL (ref <150)
VLDL: 18 mg/dL (ref 0–40)

## 2018-04-22 LAB — TSH: TSH: 6.202 u[IU]/mL — ABNORMAL HIGH (ref 0.350–4.500)

## 2018-04-22 LAB — POTASSIUM: Potassium: 4.5 mmol/L (ref 3.5–5.1)

## 2018-04-22 LAB — HEMOGLOBIN A1C
Hgb A1c MFr Bld: 6.5 % — ABNORMAL HIGH (ref 4.8–5.6)
Mean Plasma Glucose: 139.85 mg/dL

## 2018-04-22 LAB — I-STAT TROPONIN, ED: Troponin i, poc: 0.04 ng/mL (ref 0.00–0.08)

## 2018-04-22 LAB — TROPONIN I: Troponin I: 0.05 ng/mL (ref <0.03)

## 2018-04-22 LAB — D-DIMER, QUANTITATIVE: D-Dimer, Quant: 0.57 ug/mL-FEU — ABNORMAL HIGH (ref 0.00–0.50)

## 2018-04-22 MED ORDER — ASPIRIN EC 81 MG PO TBEC
81.0000 mg | DELAYED_RELEASE_TABLET | Freq: Every day | ORAL | Status: DC
  Administered 2018-04-22 – 2018-04-24 (×3): 81 mg via ORAL
  Filled 2018-04-22 (×3): qty 1

## 2018-04-22 MED ORDER — FUROSEMIDE 10 MG/ML IJ SOLN
40.0000 mg | Freq: Once | INTRAMUSCULAR | Status: AC
  Administered 2018-04-23: 40 mg via INTRAVENOUS
  Filled 2018-04-22: qty 4

## 2018-04-22 MED ORDER — ONDANSETRON HCL 4 MG PO TABS: 4.0000 mg | ORAL_TABLET | Freq: Four times a day (QID) | ORAL | Status: DC | PRN

## 2018-04-22 MED ORDER — SODIUM CHLORIDE 0.9% FLUSH
3.0000 mL | Freq: Two times a day (BID) | INTRAVENOUS | Status: DC
  Administered 2018-04-22 – 2018-04-24 (×4): 3 mL via INTRAVENOUS

## 2018-04-22 MED ORDER — ACETAMINOPHEN 325 MG PO TABS
650.0000 mg | ORAL_TABLET | Freq: Four times a day (QID) | ORAL | Status: DC | PRN
  Administered 2018-04-24: 650 mg via ORAL
  Filled 2018-04-22: qty 2

## 2018-04-22 MED ORDER — METOPROLOL SUCCINATE ER 25 MG PO TB24
12.5000 mg | ORAL_TABLET | Freq: Two times a day (BID) | ORAL | Status: DC
  Administered 2018-04-22 – 2018-04-28 (×11): 12.5 mg via ORAL
  Filled 2018-04-22 (×11): qty 1

## 2018-04-22 MED ORDER — SODIUM CHLORIDE 0.9% FLUSH: 3.0000 mL | INTRAVENOUS | Status: DC | PRN

## 2018-04-22 MED ORDER — ENOXAPARIN SODIUM 30 MG/0.3ML ~~LOC~~ SOLN
30.0000 mg | SUBCUTANEOUS | Status: DC
  Administered 2018-04-22: 30 mg via SUBCUTANEOUS
  Filled 2018-04-22: qty 0.3

## 2018-04-22 MED ORDER — POLYETHYLENE GLYCOL 3350 17 G PO PACK: 17.0000 g | PACK | Freq: Every day | ORAL | Status: DC | PRN

## 2018-04-22 MED ORDER — SODIUM CHLORIDE 0.9 % IV SOLN
250.0000 mL | INTRAVENOUS | Status: DC | PRN
  Administered 2018-04-23: 250 mL via INTRAVENOUS

## 2018-04-22 MED ORDER — SPIRONOLACTONE 25 MG PO TABS
25.0000 mg | ORAL_TABLET | Freq: Every day | ORAL | Status: DC
  Administered 2018-04-22 – 2018-04-23 (×2): 25 mg via ORAL
  Filled 2018-04-22 (×2): qty 1

## 2018-04-22 MED ORDER — ACETAMINOPHEN 650 MG RE SUPP: 650.0000 mg | Freq: Four times a day (QID) | RECTAL | Status: DC | PRN

## 2018-04-22 MED ORDER — ATORVASTATIN CALCIUM 80 MG PO TABS
80.0000 mg | ORAL_TABLET | Freq: Every day | ORAL | Status: DC
  Administered 2018-04-22: 80 mg via ORAL
  Filled 2018-04-22: qty 1

## 2018-04-22 MED ORDER — ONDANSETRON HCL 4 MG/2ML IJ SOLN: 4.0000 mg | Freq: Four times a day (QID) | INTRAMUSCULAR | Status: DC | PRN

## 2018-04-22 NOTE — ED Triage Notes (Addendum)
To ED via private vehicle , short of breath/chest pain-- states has been being treated at [facility], had stents placed "in the [facility]" and recathed recently- placed on lasix- "I only take it when I feel short of breath- do you know how bad my life is with that? Isn't there some surgery to fix this? " wife states that pt has not slept, has panic attacks.  Pt is talking nonstop- even when asked to stop talking while starting IV-  Has 1-2+pedal edema. Has not taken any meds for 2 days- thinking he would have "triple bypass surgery" when he got here.

## 2018-04-22 NOTE — ED Triage Notes (Signed)
Monitor captured a run of V-tach on pt. Sec [place] printed the strip and it was handed to EDP D.Ray.

## 2018-04-22 NOTE — Progress Notes (Signed)
Pt. With 14 and 4 beats of VTach. Cardiology at pts. bedside and notified. Pt. Asymptomatic. RN will continue to monitor pt.

## 2018-04-22 NOTE — Consult Note (Signed)
Primary Physician: Marcial Pacas, DO Primary Cardiologist: Has appt with Dr.Brian Crenshaw Reason for Consultation:    HPI:  Randall Jackson is a 58 y.o. male presenting with dyspnea on exertion x 3 months.  He had a MI in 2011 with stents placed to the RCA with LVEF of 25% which improved to 40-45% and stated he was doing well until 3 months ago.  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  He also states he is sleeping more upright. He does not complain of chest pain, other than after extensive coughing.   He quit smoking 4 months ago and has been coughing up a lot of mucous that has become more clear since quitting.  He denies his sputum being pink or red.  He had chest pain in October 2019 when he was seen at [facility] and found to have RCA CTO that could not be intervened. He was planned with medical management of his heart failure.  He states his echo showed the EF was 25%. Of his CHF meds, he did not tolerate statins well.  He states he cannot eat or drink because of the statin due to nausea and stomach pain, described as 'stomach twisting', although he does not report vomiting, unless he forces himself to vomit.  He has been eating spinach, corn, ice cream, smoothies, and other soft foods. He spoke with a friend who advised he start lasix, and he states he then got a prescription for lasix from his doctor.  He takes this PRN.  He states this medicine also gives him stomach discomfort.  He first noticed leg swelling about 3-4 months ago when his symptoms started to appear.  He has been using 2 pillows to sleep with a wedged bed. He took lasix from his PCP  And has been urinating a lot. He still has weight gain and states that his urine is dark and he feels dry. Patient is very anxious as to what could be done to save his heart. He has episodes of NSVT on tele without symptoms.    Review of Systems:     Cardiac Review of Systems: {Y]  = yes _0  = no  Chest Pain [    ]  Resting SOB [ Y  ] Exertional SOB  [ Y ]  Orthopnea [ Y ]   Pedal Edema [ Y  ]    Palpitations [ Y ] Syncope  [  ]   Presyncope [  Y ]  General Review of Systems: [Y] = yes [  ]=no Constitional: recent weight change [Y  ]; anorexia [  ]; fatigue [ Y ]; nausea [  ]; night sweats [  ]; fever [  ]; or chills [  ];                                                                     Eyes : blurred vision [  ]; diplopia [   ]; vision changes [  ];  Amaurosis fugax[  ]; Resp: cough [  ];  wheezing[  ];  hemoptysis[  ];  PND Jazmín.Cullens  ];  GI:  gallstones[  ], vomiting[  ];  dysphagia[  ]; melena[  ];  hematochezia [  ]; heartburn[  ];   GU: kidney stones [  ]; hematuria[  ];   dysuria [  ];  nocturia[  ]; incontinence [  ];             Skin: rash, swelling[  ];, hair loss[  ];  peripheral edema[  ];  or itching[  ]; Musculosketetal: myalgias[ Y ];  joint swelling[  ];  joint erythema[  ];  joint pain[  ];  back pain[  ];  Heme/Lymph: bruising[  ];  bleeding[  ];  anemia[  ];  Neuro: TIA[  ];  headaches[  ];  stroke[  ];  vertigo[  ];  seizures[  ];   paresthesias[  ];  difficulty walking[  ];  Psych:depression[  ]; Sylvester Harder ];  Endocrine: diabetes[  ];  thyroid dysfunction[  ];  Other:  Past Medical History:  Diagnosis Date  . CAD (coronary artery disease)   . Cardiomyopathy, ischemic 06/05/2013   EF 26% April 2015, Dr. Paulla Fore @ [facility]   . Chest pain   . Depression 02/13/2015  . Dyspnea    WITH CRUSHING  . H/O right coronary artery stent placement 06/26/2012   March 2015: Dr. Paulla Fore planning on [place] and possible cath for pre-op testing for Dr Ronnald Ramp' back surgery [facility])   . Heart attack ([place])   . History of pulmonary artery stenosis 06/26/2012  . Hyperlipidemia   . Kidney stone   . Lumbar radiculopathy 06/26/2012   April 2014: Degenerative changes at L5-S1 with grade 1 spondylolisthesis as a result of bilateral L5 pars interarticularis defects.  These changes result in moderate to advanced bilateral foraminal narrowing with deformity of the exiting bilateral L5 nerve roots..    . MI (myocardial infarction) ([place])   . PVC (premature ventricular contraction)   . Saddle anesthesia 06/26/2012  . Sinus tachycardia   . SOB (shortness of breath) on exertion   . Tobacco use   . Urinary incontinence 06/26/2012    Medications Prior to Admission  Medication Sig Dispense Refill  . Ascorbic Acid (VITAMIN C) 1000 MG tablet Take 1,000 mg by mouth daily.    Marland Kitchen aspirin EC 325 MG tablet Take 325 mg by mouth daily as needed (headaches).    Marland Kitchen aspirin EC 81 MG tablet Take 81 mg by mouth daily.    Marland Kitchen atorvastatin (LIPITOR) 80 MG tablet Take 80 mg by mouth at bedtime.    . Coenzyme Q10 (COQ10) 200 MG CAPS Take 200 mg by mouth daily.    . metoprolol succinate (TOPROL-XL) 25 MG 24 hr tablet Take 12.5 mg by mouth 2 (two) times daily.    . Multiple Vitamin (MULTIVITAMIN WITH MINERALS) TABS tablet Take 1 tablet by mouth daily. [place] Mega Men    . Multiple Vitamins-Minerals (ZINC PO) Take 1 tablet by mouth daily as needed (shortness of breath).    . Papaya Enzyme CHEW Chew 3-4 tablets by mouth daily as needed (heartburn).    . pyridoxine (B-6) 200 MG tablet Take 200 mg by mouth daily.    Marland Kitchen spironolactone (ALDACTONE) 25 MG tablet Take 25 mg by mouth daily.    . vitamin E 1000 UNIT capsule Take 1,000 Units by mouth daily.    . carvedilol (COREG) 12.5 MG tablet Take 1 tablet (12.5 mg total) by mouth 2 (two) times daily with a meal. (Patient not taking: Reported on 04/22/2018) 60 tablet 3  .  lisinopril (PRINIVIL,ZESTRIL) 5 MG tablet Take 1 tablet (5 mg total) by mouth daily. (Patient not taking: Reported on 04/22/2018) 90 tablet 3  . pravastatin (PRAVACHOL) 40 MG tablet Take 1 tablet (40 mg total) by mouth daily. (Patient not taking: Reported on 09/28/2015) 90 tablet 1  . topiramate (TOPAMAX) 100 MG tablet Take 1 tablet (100 mg total) by mouth 2 (two) times daily. (Patient not  taking: Reported on 04/22/2018) 60 tablet 0     . aspirin EC  81 mg Oral Daily  . atorvastatin  80 mg Oral QHS  . enoxaparin (LOVENOX) injection  30 mg Subcutaneous Q24H  . metoprolol succinate  12.5 mg Oral BID  . sodium chloride flush  3 mL Intravenous Q12H  . spironolactone  25 mg Oral Daily    Infusions: . sodium chloride      Allergies  Allergen Reactions  . Codeine Anaphylaxis and Swelling  . Gabapentin Other (See Comments)    Cognitive issues    Social History   Socioeconomic History  . Marital status: Married    Spouse name: Not on file  . Number of children: Not on file  . Years of education: Not on file  . Highest education level: Not on file  Occupational History  . Not on file  Social Needs  . Financial resource strain: Not on file  . Food insecurity:    Worry: Not on file    Inability: Not on file  . Transportation needs:    Medical: Not on file    Non-medical: Not on file  Tobacco Use  . Smoking status: Former Research scientist (life sciences)  . Smokeless tobacco: Never Used  Substance and Sexual Activity  . Alcohol use: Not Currently    Alcohol/week: 1.0 standard drinks    Types: 1 Standard drinks or equivalent per week  . Drug use: No  . Sexual activity: Not Currently  Lifestyle  . Physical activity:    Days per week: Not on file    Minutes per session: Not on file  . Stress: Not on file  Relationships  . Social connections:    Talks on phone: Not on file    Gets together: Not on file    Attends religious service: Not on file    Active member of club or organization: Not on file    Attends meetings of clubs or organizations: Not on file    Relationship status: Not on file  . Intimate partner violence:    Fear of current or ex partner: Not on file    Emotionally abused: Not on file    Physically abused: Not on file    Forced sexual activity: Not on file  Other Topics Concern  . Not on file  Social History Narrative  . Not on file    Family History    Problem Relation Age of Onset  . Diabetes Mother        father    PHYSICAL EXAM: Vitals:   04/22/18 2049 04/22/18 2143  BP:  (!) 135/96  Pulse:  (!) 107  Resp:  20  Temp: 98.7 F (37.1 C) 98.6 F (37 C)  SpO2:  95%     Intake/Output Summary (Last 24 hours) at 04/22/2018 2225 Last data filed at 04/22/2018 2215 Gross per 24 hour  Intake 240 ml  Output -  Net 240 ml    General:  Well appearing. No respiratory difficulty HEENT: normal Neck: supple. no JVD. Carotids 2+ bilat; no bruits. No lymphadenopathy or thryomegaly  appreciated. Cor: PMI nondisplaced. Regular rate & rhythm. No rubs, gallops or murmurs. Lungs: clear Abdomen: soft, nontender, nondistended. No hepatosplenomegaly. No bruits or masses. Good bowel sounds. Extremities: no cyanosis, clubbing, rash, edema Neuro: alert & oriented x 3, cranial nerves grossly intact. moves all 4 extremities w/o difficulty. Affect pleasant.  ECG: Sinus tachycardia with LAE. Tele showed NSVT.  Results for orders placed or performed during the hospital encounter of 04/22/18 (from the past 24 hour(s))  Basic metabolic panel     Status: Abnormal   Collection Time: 04/22/18  4:20 PM  Result Value Ref Range   Sodium 133 (L) 135 - 145 mmol/L   Potassium 6.5 (HH) 3.5 - 5.1 mmol/L   Chloride 104 98 - 111 mmol/L   CO2 18 (L) 22 - 32 mmol/L   Glucose, Bld 140 (H) 70 - 99 mg/dL   BUN 28 (H) 6 - 20 mg/dL   Creatinine, Ser 1.38 (H) 0.61 - 1.24 mg/dL   Calcium 8.4 (L) 8.9 - 10.3 mg/dL   GFR calc non Af Amer 57 (L) >60 mL/min   GFR calc Af Amer >60 >60 mL/min   Anion gap 11 5 - 15  CBC with Differential     Status: Abnormal   Collection Time: 04/22/18  4:20 PM  Result Value Ref Range   WBC 13.6 (H) 4.0 - 10.5 K/uL   RBC 6.19 (H) 4.22 - 5.81 MIL/uL   Hemoglobin 18.7 (H) 13.0 - 17.0 g/dL   HCT 57.1 (H) 39.0 - 52.0 %   MCV 92.2 80.0 - 100.0 fL   MCH 30.2 26.0 - 34.0 pg   MCHC 32.7 30.0 - 36.0 g/dL   RDW 14.9 11.5 - 15.5 %   Platelets 288  150 - 400 K/uL   nRBC 0.0 0.0 - 0.2 %   Neutrophils Relative % 58 %   Neutro Abs 8.0 (H) 1.7 - 7.7 K/uL   Lymphocytes Relative 24 %   Lymphs Abs 3.2 0.7 - 4.0 K/uL   Monocytes Relative 15 %   Monocytes Absolute 2.0 (H) 0.1 - 1.0 K/uL   Eosinophils Relative 2 %   Eosinophils Absolute 0.2 0.0 - 0.5 K/uL   Basophils Relative 1 %   Basophils Absolute 0.1 0.0 - 0.1 K/uL   Immature Granulocytes 0 %   Abs Immature Granulocytes 0.06 0.00 - 0.07 K/uL  Brain natriuretic peptide     Status: Abnormal   Collection Time: 04/22/18  4:20 PM  Result Value Ref Range   B Natriuretic Peptide 954.3 (H) 0.0 - 100.0 pg/mL  D-dimer, quantitative (not at [facility])     Status: Abnormal   Collection Time: 04/22/18  6:00 PM  Result Value Ref Range   D-Dimer, Quant 0.57 (H) 0.00 - 0.50 ug/mL-FEU  Potassium     Status: None   Collection Time: 04/22/18  6:00 PM  Result Value Ref Range   Potassium 4.5 3.5 - 5.1 mmol/L  I-Stat Troponin, ED - 0, 3, 6 hours (not at [facility])     Status: None   Collection Time: 04/22/18  7:06 PM  Result Value Ref Range   Troponin i, poc 0.04 0.00 - 0.08 ng/mL   Comment 3          Hemoglobin A1c     Status: Abnormal   Collection Time: 04/22/18  9:52 PM  Result Value Ref Range   Hgb A1c MFr Bld 6.5 (H) 4.8 - 5.6 %   Mean Plasma Glucose 139.85 mg/dL   Dg  Chest Port 1 View  Result Date: 04/22/2018 CLINICAL DATA:  Acute shortness of breath and chest pain. EXAM: PORTABLE CHEST 1 VIEW COMPARISON:  None. FINDINGS: Cardiomegaly and pulmonary vascular congestion noted. There is no evidence of focal airspace disease, pulmonary edema, suspicious pulmonary nodule/mass, pleural effusion, or pneumothorax. No acute bony abnormalities are identified. IMPRESSION: Cardiomegaly with pulmonary vascular congestion. Electronically Signed   By: Margarette Canada M.D.   On: 04/22/2018 17:20   Cardiac Cath: 2019 Result Date: 01/22/2018  Dist RCA lesion is 100% stenosed. 100% CTO and instent restenosis of PLB. 50%  distal RCA  There is severe left ventricular systolic dysfunction. EDP is 30  The ejection fraction is 20-30% by visual estimate.  Mild wall irregularities in LAD and Lcx  Unsuccessful attempt to cross the PLB CTO: Procedure summery: Right radial artery using Seldinger technique.  Echo: The study was technically difficult. The left ventricle is moderately dilated. There is mild concentric left ventricular hypertrophy. The left atrium is mildly dilated. The left ventricular ejection fraction is markedly reduced (25-30%). There is severe diffuse hypokinesis of the left ventricle. There is mild (1+) tricuspid regurgitation. Right ventricular systolic pressure is elevated between 50-73m Hg, consistent with moderately severe pulmonary hypertension. There is moderate to moderately severe (2-3+) mitral regurgitation. Dilated inferior vena cava suggests increased right atrial pressure. Left Ventricle The left ventricle is moderately dilated. There is mild concentric left ventricular hypertrophy. The left ventricular ejection fraction is markedly reduced (25-30%). There is severe diffuse hypokinesis of the left ventricle. There is severe (Grade III/IV) diastolic dysfunction; reversible restrictive pattern of mitral inflow. Right Ventricle The right ventricle is grossly normal in size and function. Atria The left atrium is mildly dilated. The right atrium is normal. Dilated inferior vena cava suggests increased right atrial pressure. Mitral Valve The mitral valve is grossly normal. There is moderate to moderately severe (2-3+) mitral regurgitation. Tricuspid Valve The tricuspid valve is grossly normal. There is mild (1+) tricuspid regurgitation.Right ventricular systolic pressure is elevated between 50- 674mHg, consistent with moderately severe pulmonary hypertension. Aortic Valve The aortic valve is trileaflet. The aortic valve opens well. There is no aortic stenosis. There is no aortic regurgitation present.  Pulmonic Valve The pulmonic valve is not well visualized, unable to adequately assess function. Vessels The aortic root is normal in diameter. Pericardium There is no pericardial effusion.   ASSESSMENT and PLAN: CHF, ACC-C, NYHA III with cardiomyopathy (? Non ischemic): Patient has CHF exacebration with his coronary anatomy that does not fit ischemic cardiomyopathy. He is on spironolactone and metoprolol. I would give a dose of IV lasix and assess his response and kidney function. May need to switch to entresto once stabilized. Advised medication compliance and low salt fluid restricted diet. May need AICD if LVEF is not improving with optimal medical management given his episodes of NSVT.  Mitral regurgitation: Likely secondary to dilated cardiomyopathy. Will do an echo.  CAD s/p RCA stent: Appears stable. Continue with ASA and statins. On BB.  AKI: Will trial a dose of IV lasix and assess response as to whether patient is intravascularly dry. If eGFR increases, will need to hydrate.

## 2018-04-22 NOTE — ED Provider Notes (Signed)
[facility] EMERGENCY DEPARTMENT Provider Note   CSN: [id number] Arrival date & time: 04/22/18  1609    History   Chief Complaint Chief Complaint  Patient presents with  . Chest Pain  . Shortness of Breath    HPI Randall Jackson is a 58 y.o. male who  Has a pmh of coronary artery disease, ischemic cardiomyopathy history of stents, history of MI, history of pulmonary artery stenosis and CHF who presents the emergency department with chief complaint of shortness of breath.  He has been followed by [facility] cardiology. He has a 20 pack year smoking history but states that he has quit. He also has a history of poor OP medical compliance. He was admitted to [facility] in 12/2017 for CP and exertional dyspnea. He has a coronary cath that showed no occlusive vessel disease. His echocardiogram showed ef 25-30% and 2-3+ MR and severe pulmonary htn. He was diuresed of 6 L. Patient states that he has continued to have severe anxiety and SOB worse with exertion. He states that he was placed on high dose lipitor which " nearly killed me."  He has noticed some swelling in his legs. He states that the lasix is making him "pee all the time," and that he can't "keep playing this lasix game." He states that his anxiety has been severe and he can't sleep. He c/o PND. He states that he has not been taking his asa because he needs "a surgery to fix this" so he can stop "playing this lasix game."  HPI: A 58 year old patient with a history of hypertension, hypercholesterolemia and obesity presents for evaluation of chest pain. Initial onset of pain was more than 6 hours ago. The patient's chest pain is not worse with exertion. The patient's chest pain is not middle- or left-sided, is not well-localized, is not described as heaviness/pressure/tightness, is not sharp and does not radiate to the arms/jaw/neck. The patient does not complain of nausea and denies diaphoresis. The patient has no history of  stroke, has no history of peripheral artery disease, has not smoked in the past 90 days, denies any history of treated diabetes and has no relevant family history of coronary artery disease (first degree relative at less than age 32).   HPI  Past Medical History:  Diagnosis Date  . CAD (coronary artery disease)   . Cardiomyopathy, ischemic 06/05/2013   EF 26% April 2015, Dr. Lehman Prom @ [facility]   . Chest pain   . Depression 02/13/2015  . Dyspnea    WITH CRUSHING  . H/O right coronary artery stent placement 06/26/2012   March 2015: Dr. Lehman Prom planning on lexiscan and possible cath for pre-op testing for Dr Yetta Barre' back surgery [facility])   . Heart attack (HCC)   . History of pulmonary artery stenosis 06/26/2012  . Hyperlipidemia   . Kidney stone   . Lumbar radiculopathy 06/26/2012   April 2014: Degenerative changes at L5-S1 with grade 1 spondylolisthesis as a result of bilateral L5 pars interarticularis defects. These changes result in moderate to advanced bilateral foraminal narrowing with deformity of the exiting bilateral L5 nerve roots..    . MI (myocardial infarction) (HCC)   . PVC (premature ventricular contraction)   . Saddle anesthesia 06/26/2012  . Sinus tachycardia   . SOB (shortness of breath) on exertion   . Tobacco use   . Urinary incontinence 06/26/2012    Patient Active Problem List   Diagnosis Date Noted  . Depression 02/13/2015  .  Cardiomyopathy, ischemic 06/05/2013  . Lumbar radiculopathy 06/26/2012  . Saddle anesthesia 06/26/2012  . Urinary incontinence 06/26/2012  . H/O right coronary artery stent placement 06/26/2012  . History of pulmonary artery stenosis 06/26/2012    Past Surgical History:  Procedure Laterality Date  . cardiac stents    . KNEE SURGERY  1998  . stents placed  2011        Home Medications    Prior to Admission medications   Medication Sig Start Date End Date Taking? Authorizing Provider  Ascorbic Acid (VITAMIN C) 1000 MG  tablet Take 1,000 mg by mouth daily.   Yes [provider]  aspirin EC 325 MG tablet Take 325 mg by mouth daily as needed (headaches).   Yes [provider]  aspirin EC 81 MG tablet Take 81 mg by mouth daily.   Yes [provider]  atorvastatin (LIPITOR) 80 MG tablet Take 80 mg by mouth at bedtime. 02/22/18  Yes [provider]  Coenzyme Q10 (COQ10) 200 MG CAPS Take 200 mg by mouth daily.   Yes [provider]  metoprolol succinate (TOPROL-XL) 25 MG 24 hr tablet Take 12.5 mg by mouth 2 (two) times daily. 01/22/18  Yes [provider]  Multiple Vitamin (MULTIVITAMIN WITH MINERALS) TABS tablet Take 1 tablet by mouth daily. [employer] Mega Men   Yes [provider]  Multiple Vitamins-Minerals (ZINC PO) Take 1 tablet by mouth daily as needed (shortness of breath).   Yes [provider]  Papaya Enzyme CHEW Chew 3-4 tablets by mouth daily as needed (heartburn).   Yes [provider]  pyridoxine (B-6) 200 MG tablet Take 200 mg by mouth daily.   Yes [provider]  spironolactone (ALDACTONE) 25 MG tablet Take 25 mg by mouth daily. 02/22/18  Yes [provider]  vitamin E 1000 UNIT capsule Take 1,000 Units by mouth daily.   Yes [provider]  carvedilol (COREG) 12.5 MG tablet Take 1 tablet (12.5 mg total) by mouth 2 (two) times daily with a meal. Patient not taking: Reported on 04/22/2018 02/13/15   Laren BoomHommel, Sean, DO  lisinopril (PRINIVIL,ZESTRIL) 5 MG tablet Take 1 tablet (5 mg total) by mouth daily. Patient not taking: Reported on 04/22/2018 02/13/15   Laren BoomHommel, Sean, DO  pravastatin (PRAVACHOL) 40 MG tablet Take 1 tablet (40 mg total) by mouth daily. Patient not taking: Reported on 09/28/2015 02/13/15   Laren BoomHommel, Sean, DO  topiramate (TOPAMAX) 100 MG tablet Take 1 tablet (100 mg total) by mouth 2 (two) times daily. Patient not taking: Reported on 04/22/2018 02/13/15   Laren BoomHommel, Sean, DO    Family History Family  History  Problem Relation Age of Onset  . Diabetes Mother        father    Social History Social History   Tobacco Use  . Smoking status: Former Games developermoker  . Smokeless tobacco: Never Used  Substance Use Topics  . Alcohol use: Not Currently    Alcohol/week: 1.0 standard drinks    Types: 1 Standard drinks or equivalent per week  . Drug use: No     Allergies   Codeine and Gabapentin   Review of Systems Review of Systems Ten systems reviewed and are negative for acute change, except as noted in the HPI.    Physical Exam Updated Vital Signs BP (!) 109/36   Pulse 85   Temp 97.7 F (36.5 C) (Oral)   Resp (!) 22   Ht 6' (1.829 m)   Wt 117.9  kg   SpO2 95%   BMI 35.26 kg/m   Physical Exam Vitals signs and nursing note reviewed.  Constitutional:      General: He is not in acute distress.    Appearance: He is well-developed. He is not diaphoretic.  HENT:     Head: Normocephalic and atraumatic.  Eyes:     General: No scleral icterus.    Conjunctiva/sclera: Conjunctivae normal.  Neck:     Musculoskeletal: Normal range of motion and neck supple.  Cardiovascular:     Rate and Rhythm: Normal rate and regular rhythm.     Heart sounds: Normal heart sounds.  Pulmonary:     Effort: Pulmonary effort is normal. Tachypnea present. No respiratory distress.     Breath sounds: Normal breath sounds. No wheezing or rales.  Abdominal:     Palpations: Abdomen is soft.     Tenderness: There is no abdominal tenderness.  Musculoskeletal:     Right lower leg: Edema present.     Left lower leg: Edema present.  Skin:    General: Skin is warm and dry.  Neurological:     Mental Status: He is alert.  Psychiatric:        Behavior: Behavior normal.     Comments: anxious        ED Treatments / Results  Labs (all labs ordered are listed, but only abnormal results are displayed) Labs Reviewed  BASIC METABOLIC PANEL - Abnormal; Notable for the following components:      Result  Value   Sodium 133 (*)    Potassium 6.5 (*)    CO2 18 (*)    Glucose, Bld 140 (*)    BUN 28 (*)    Creatinine, Ser 1.38 (*)    Calcium 8.4 (*)    GFR calc non Af Amer 57 (*)    All other components within normal limits  CBC WITH DIFFERENTIAL/PLATELET - Abnormal; Notable for the following components:   WBC 13.6 (*)    RBC 6.19 (*)    Hemoglobin 18.7 (*)    HCT 57.1 (*)    Neutro Abs 8.0 (*)    Monocytes Absolute 2.0 (*)    All other components within normal limits  BRAIN NATRIURETIC PEPTIDE - Abnormal; Notable for the following components:   B Natriuretic Peptide 954.3 (*)    All other components within normal limits  D-DIMER, QUANTITATIVE (NOT AT [facility]) - Abnormal; Notable for the following components:   D-Dimer, Quant 0.57 (*)    All other components within normal limits  POTASSIUM  I-STAT TROPONIN, ED    EKG EKG Interpretation  Date/Time:  Sunday April 22 2018 16:15:43 EST Ventricular Rate:  116 PR Interval:    QRS Duration: 90 QT Interval:  326 QTC Calculation: 453 R Axis:   88 Text Interpretation:  Sinus tachycardia LAE, consider biatrial enlargement Anterior infarct, old No old tracing to compare Confirmed by Margarita Grizzle [id number]) on 04/22/2018 7:01:57 PM   Radiology Dg Chest Port 1 View  Result Date: 04/22/2018 CLINICAL DATA:  Acute shortness of breath and chest pain. EXAM: PORTABLE CHEST 1 VIEW COMPARISON:  None. FINDINGS: Cardiomegaly and pulmonary vascular congestion noted. There is no evidence of focal airspace disease, pulmonary edema, suspicious pulmonary nodule/mass, pleural effusion, or pneumothorax. No acute bony abnormalities are identified. IMPRESSION: Cardiomegaly with pulmonary vascular congestion. Electronically Signed   By: Harmon Pier M.D.   On: 04/22/2018 17:20    Procedures .Critical Care Performed by: Arthor Captain, PA-C Authorized by:  Arthor Captain, PA-C   Critical care provider statement:    Critical care time (minutes):  45   Critical  care was necessary to treat or prevent imminent or life-threatening deterioration of the following conditions:  Cardiac failure   Critical care was time spent personally by me on the following activities:  Discussions with consultants, evaluation of patient's response to treatment, examination of patient, ordering and performing treatments and interventions, ordering and review of laboratory studies, ordering and review of radiographic studies, pulse oximetry, re-evaluation of patient's condition, obtaining history from patient or surrogate and review of old charts   (including critical care time)  Medications Ordered in ED Medications - No data to display   Initial Impression / Assessment and Plan / ED Course  I have reviewed the triage vital signs and the nursing notes.  Pertinent labs & imaging results that were available during my care of the patient were reviewed by me and considered in my medical decision making (see chart for details).  Clinical Course as of Apr 21 2333  Wynelle Link Apr 22, 2018  1730 AKI-last CR 0.90 12/2017 [facility]  Creatinine(!): 1.38 [AH]  1827 Ekg without peaked t waves or wide QRS. Will recheck potassium for hemolysis.  Potassium(!!): 6.5 [AH]  1854 D-dimer negative age corrected.  D-Dimer, Quant(!): 0.57 [AH]  [id number] Patient noted to have a run of V-tach. ? If potassium is elevated.- awaiting repeat however this is more likely related to his    [AH]  1919 Potassium Is normal  Potassium: 4.5 [AH]    Clinical Course User Index [AH] Arthor Captain, PA-C    HEAR Score: 83   58 year old male with moderate risk heart score, known CAD, history of CHF with EF of 25%.  He had a run of V. tach here and will need to come in likely for AICD placement.  The patient also notably has an acute kidney injury.  He may have some volume depletion with hypotension.  Patient will be admitted IV resident service.  He is stable throughout his ER visit..  Review of labs show  patient has elevated d-dimer however it is negative when a age-adjusted.  His white blood cell count is slightly elevated.  Patient's BNP is also elevated however we do not have a previous to compare.  Patient's initial potassium hemolyzed and elevated however repeat is negative elevation.  Patient does have an acute kidney injury.  His portable chest x-ray shows some pulmonary vascular congestion however he does not appear clinically volume overloaded.  EKG shows no acute ischemic changes Final Clinical Impressions(s) / ED Diagnoses   Final diagnoses:  V-tach [facility])    ED Discharge Orders    None       Arthor Captain, PA-C 04/22/18 2341    Margarita Grizzle, MD 04/23/18 [phone]

## 2018-04-22 NOTE — Consult Note (Signed)
Primary Physician: Marcial Pacas, DO Primary Cardiologist: Has appt with Dr.Brian Crenshaw Reason for Consultation:    HPI:  Randall Jackson is a 58 y.o. male presenting with dyspnea on exertion x 3 months.  He had a MI in 2011 with stents placed to the RCA with LVEF of 25% which improved to 40-45% and stated he was doing well until 3 months ago.  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  He also states he is sleeping more upright. He does not complain of chest pain, other than after extensive coughing.   He quit smoking 4 months ago and has been coughing up a lot of mucous that has become more clear since quitting.  He denies his sputum being pink or red.  He states in November, after thanksgiving he went to [facility] and they tried to re-stent him but that it was 'blocked' and they decided to go with medical management of his heart failure.  He states his echo showed the EF was 25%.  They put him on a beta blocker, a hypertension medicine, and a statin.  He does not like being on any of them.  He states he cannot eat or drink because of the statin due to nausea and stomach pain, described as 'stomach twisting', although he does not report vomiting, unless he forces himself to vomit.  He has been eating spinach, corn, ice cream, smoothies, and other soft foods. He spoke with a friend who advised he start lasix, and he states he then got a prescription for lasix from his doctor.  He takes this PRN.  He states this medicine also gives him stomach discomfort.  He first noticed leg swelling about 3-4 months ago when his symptoms started to appear.     Review of Systems:     Cardiac Review of Systems: {Y] = yes '[ ]'  = no  Chest Pain [    ]  Resting SOB [ Y  ] Exertional SOB  [ Y ]  Orthopnea [ Y ]   Pedal Edema [ Y  ]    Palpitations [ Y ] Syncope  [  ]   Presyncope [  Y ]  General Review of Systems: [Y] = yes [  ]=no Constitional:  recent weight change [Y  ]; anorexia [  ]; fatigue [ Y ]; nausea [  ]; night sweats [  ]; fever [  ]; or chills [  ];                                                                     Eyes : blurred vision [  ]; diplopia [   ]; vision changes [  ];  Amaurosis fugax[  ]; Resp: cough [  ];  wheezing[  ];  hemoptysis[  ];  PND Jazmín.Cullens  ];  GI:  gallstones[  ], vomiting[  ];  dysphagia[  ]; melena[  ];  hematochezia [  ]; heartburn[  ];   GU: kidney stones [  ]; hematuria[  ];   dysuria [  ];  nocturia[  ]; incontinence [  ];  Skin: rash, swelling[  ];, hair loss[  ];  peripheral edema[  ];  or itching[  ]; Musculosketetal: myalgias[  ];  joint swelling[  ];  joint erythema[  ];  joint pain[  ];  back pain[  ];  Heme/Lymph: bruising[  ];  bleeding[  ];  anemia[  ];  Neuro: TIA[  ];  headaches[  ];  stroke[  ];  vertigo[  ];  seizures[  ];   paresthesias[  ];  difficulty walking[  ];  Psych:depression[  ]; Sylvester Harder ];  Endocrine: diabetes[  ];  thyroid dysfunction[  ];  Other:  Past Medical History:  Diagnosis Date  . CAD (coronary artery disease)   . Cardiomyopathy, ischemic 06/05/2013   EF 26% April 2015, Dr. Paulla Fore @ [place]   . Chest pain   . Depression 02/13/2015  . Dyspnea    WITH CRUSHING  . H/O right coronary artery stent placement 06/26/2012   March 2015: Dr. Paulla Fore planning on [place] and possible cath for pre-op testing for Dr Ronnald Ramp' back surgery [facility])   . Heart attack ([place])   . History of pulmonary artery stenosis 06/26/2012  . Hyperlipidemia   . Kidney stone   . Lumbar radiculopathy 06/26/2012   April 2014: Degenerative changes at L5-S1 with grade 1 spondylolisthesis as a result of bilateral L5 pars interarticularis defects. These changes result in moderate to advanced bilateral foraminal narrowing with deformity of the exiting bilateral L5 nerve roots..    . MI (myocardial infarction) ([place])   . PVC (premature ventricular contraction)   . Saddle  anesthesia 06/26/2012  . Sinus tachycardia   . SOB (shortness of breath) on exertion   . Tobacco use   . Urinary incontinence 06/26/2012    Medications Prior to Admission  Medication Sig Dispense Refill  . Ascorbic Acid (VITAMIN C) 1000 MG tablet Take 1,000 mg by mouth daily.    Marland Kitchen aspirin EC 325 MG tablet Take 325 mg by mouth daily as needed (headaches).    Marland Kitchen aspirin EC 81 MG tablet Take 81 mg by mouth daily.    Marland Kitchen atorvastatin (LIPITOR) 80 MG tablet Take 80 mg by mouth at bedtime.    . Coenzyme Q10 (COQ10) 200 MG CAPS Take 200 mg by mouth daily.    . metoprolol succinate (TOPROL-XL) 25 MG 24 hr tablet Take 12.5 mg by mouth 2 (two) times daily.    . Multiple Vitamin (MULTIVITAMIN WITH MINERALS) TABS tablet Take 1 tablet by mouth daily. [facility] Mega Men    . Multiple Vitamins-Minerals (ZINC PO) Take 1 tablet by mouth daily as needed (shortness of breath).    . Papaya Enzyme CHEW Chew 3-4 tablets by mouth daily as needed (heartburn).    . pyridoxine (B-6) 200 MG tablet Take 200 mg by mouth daily.    Marland Kitchen spironolactone (ALDACTONE) 25 MG tablet Take 25 mg by mouth daily.    . vitamin E 1000 UNIT capsule Take 1,000 Units by mouth daily.    . carvedilol (COREG) 12.5 MG tablet Take 1 tablet (12.5 mg total) by mouth 2 (two) times daily with a meal. (Patient not taking: Reported on 04/22/2018) 60 tablet 3  . lisinopril (PRINIVIL,ZESTRIL) 5 MG tablet Take 1 tablet (5 mg total) by mouth daily. (Patient not taking: Reported on 04/22/2018) 90 tablet 3  . pravastatin (PRAVACHOL) 40 MG tablet Take 1 tablet (40 mg total) by mouth daily. (Patient not taking: Reported on 09/28/2015) 90 tablet 1  . topiramate (  TOPAMAX) 100 MG tablet Take 1 tablet (100 mg total) by mouth 2 (two) times daily. (Patient not taking: Reported on 04/22/2018) 60 tablet 0     . aspirin EC  81 mg Oral Daily  . atorvastatin  80 mg Oral QHS  . enoxaparin (LOVENOX) injection  30 mg Subcutaneous Q24H  . metoprolol succinate  12.5 mg Oral BID  . sodium  chloride flush  3 mL Intravenous Q12H  . spironolactone  25 mg Oral Daily    Infusions: . sodium chloride      Allergies  Allergen Reactions  . Codeine Anaphylaxis and Swelling  . Gabapentin Other (See Comments)    Cognitive issues    Social History   Socioeconomic History  . Marital status: Married    Spouse name: Not on file  . Number of children: Not on file  . Years of education: Not on file  . Highest education level: Not on file  Occupational History  . Not on file  Social Needs  . Financial resource strain: Not on file  . Food insecurity:    Worry: Not on file    Inability: Not on file  . Transportation needs:    Medical: Not on file    Non-medical: Not on file  Tobacco Use  . Smoking status: Former Research scientist (life sciences)  . Smokeless tobacco: Never Used  Substance and Sexual Activity  . Alcohol use: Not Currently    Alcohol/week: 1.0 standard drinks    Types: 1 Standard drinks or equivalent per week  . Drug use: No  . Sexual activity: Not Currently  Lifestyle  . Physical activity:    Days per week: Not on file    Minutes per session: Not on file  . Stress: Not on file  Relationships  . Social connections:    Talks on phone: Not on file    Gets together: Not on file    Attends religious service: Not on file    Active member of club or organization: Not on file    Attends meetings of clubs or organizations: Not on file    Relationship status: Not on file  . Intimate partner violence:    Fear of current or ex partner: Not on file    Emotionally abused: Not on file    Physically abused: Not on file    Forced sexual activity: Not on file  Other Topics Concern  . Not on file  Social History Narrative  . Not on file    Family History  Problem Relation Age of Onset  . Diabetes Mother        father    PHYSICAL EXAM: Vitals:   04/22/18 2049 04/22/18 2143  BP:  (!) 135/96  Pulse:  (!) 107  Resp:  20  Temp: 98.7 F (37.1 C) 98.6 F (37 C)  SpO2:  95%      Intake/Output Summary (Last 24 hours) at 04/22/2018 2225 Last data filed at 04/22/2018 2215 Gross per 24 hour  Intake 240 ml  Output -  Net 240 ml    General:  Well appearing. No respiratory difficulty HEENT: normal Neck: supple. no JVD. Carotids 2+ bilat; no bruits. No lymphadenopathy or thryomegaly appreciated. Cor: PMI nondisplaced. Regular rate & rhythm. No rubs, gallops or murmurs. Lungs: clear Abdomen: soft, nontender, nondistended. No hepatosplenomegaly. No bruits or masses. Good bowel sounds. Extremities: no cyanosis, clubbing, rash, edema Neuro: alert & oriented x 3, cranial nerves grossly intact. moves all 4 extremities w/o difficulty. Affect pleasant.  ECG:  Results for orders placed or performed during the hospital encounter of 04/22/18 (from the past 24 hour(s))  Basic metabolic panel     Status: Abnormal   Collection Time: 04/22/18  4:20 PM  Result Value Ref Range   Sodium 133 (L) 135 - 145 mmol/L   Potassium 6.5 (HH) 3.5 - 5.1 mmol/L   Chloride 104 98 - 111 mmol/L   CO2 18 (L) 22 - 32 mmol/L   Glucose, Bld 140 (H) 70 - 99 mg/dL   BUN 28 (H) 6 - 20 mg/dL   Creatinine, Ser 1.38 (H) 0.61 - 1.24 mg/dL   Calcium 8.4 (L) 8.9 - 10.3 mg/dL   GFR calc non Af Amer 57 (L) >60 mL/min   GFR calc Af Amer >60 >60 mL/min   Anion gap 11 5 - 15  CBC with Differential     Status: Abnormal   Collection Time: 04/22/18  4:20 PM  Result Value Ref Range   WBC 13.6 (H) 4.0 - 10.5 K/uL   RBC 6.19 (H) 4.22 - 5.81 MIL/uL   Hemoglobin 18.7 (H) 13.0 - 17.0 g/dL   HCT 57.1 (H) 39.0 - 52.0 %   MCV 92.2 80.0 - 100.0 fL   MCH 30.2 26.0 - 34.0 pg   MCHC 32.7 30.0 - 36.0 g/dL   RDW 14.9 11.5 - 15.5 %   Platelets 288 150 - 400 K/uL   nRBC 0.0 0.0 - 0.2 %   Neutrophils Relative % 58 %   Neutro Abs 8.0 (H) 1.7 - 7.7 K/uL   Lymphocytes Relative 24 %   Lymphs Abs 3.2 0.7 - 4.0 K/uL   Monocytes Relative 15 %   Monocytes Absolute 2.0 (H) 0.1 - 1.0 K/uL   Eosinophils Relative 2 %    Eosinophils Absolute 0.2 0.0 - 0.5 K/uL   Basophils Relative 1 %   Basophils Absolute 0.1 0.0 - 0.1 K/uL   Immature Granulocytes 0 %   Abs Immature Granulocytes 0.06 0.00 - 0.07 K/uL  Brain natriuretic peptide     Status: Abnormal   Collection Time: 04/22/18  4:20 PM  Result Value Ref Range   B Natriuretic Peptide 954.3 (H) 0.0 - 100.0 pg/mL  D-dimer, quantitative (not at [facility])     Status: Abnormal   Collection Time: 04/22/18  6:00 PM  Result Value Ref Range   D-Dimer, Quant 0.57 (H) 0.00 - 0.50 ug/mL-FEU  Potassium     Status: None   Collection Time: 04/22/18  6:00 PM  Result Value Ref Range   Potassium 4.5 3.5 - 5.1 mmol/L  I-Stat Troponin, ED - 0, 3, 6 hours (not at [facility])     Status: None   Collection Time: 04/22/18  7:06 PM  Result Value Ref Range   Troponin i, poc 0.04 0.00 - 0.08 ng/mL   Comment 3          Hemoglobin A1c     Status: Abnormal   Collection Time: 04/22/18  9:52 PM  Result Value Ref Range   Hgb A1c MFr Bld 6.5 (H) 4.8 - 5.6 %   Mean Plasma Glucose 139.85 mg/dL   Dg Chest Port 1 View  Result Date: 04/22/2018 CLINICAL DATA:  Acute shortness of breath and chest pain. EXAM: PORTABLE CHEST 1 VIEW COMPARISON:  None. FINDINGS: Cardiomegaly and pulmonary vascular congestion noted. There is no evidence of focal airspace disease, pulmonary edema, suspicious pulmonary nodule/mass, pleural effusion, or pneumothorax. No acute bony abnormalities are identified. IMPRESSION: Cardiomegaly with pulmonary  vascular congestion. Electronically Signed   By: Margarette Canada M.D.   On: 04/22/2018 17:20    ASSESSMENT and PLAN: CHF, ACC-C, NYHA III with cardiomyopathy (? Non ischemic): Patient has CHF exacebration with his coronary anatomy that does not fit ischemic cardiomyopathy. He is on spironolactone and metoprolol. I would give a dose of IV lasix and assess his response and kidney function. May need to switch to entresto once stabilized. Advised medication compliance and low salt fluid  restricted diet. May need AICD if LVEF is not improving with optimal medical management given his episodes of NSVT.  Mitral regurgitation: Likely secondary to dilated cardiomyopathy. Will do an echo.  CAD s/p RCA stent: Appears stable. Continue with ASA and statins. On BB.  AKI: Will trial a dose of IV lasix and assess response as to whether patient is intravascularly dry. If eGFR increases, will need to hydrate.

## 2018-04-22 NOTE — H&P (Addendum)
Family Medicine Teaching [facility] Admission History and Physical Service Pager: [phone]  Patient name: Randall Jackson Medical record number: [id number] Date of birth: 09-16-1960 Age: 58 y.o. Gender: male  Primary Care Provider: Laren Boom, DO Consultants: cardiology  Code Status: FULL  Chief Complaint: shortness of breath, leg swelling   Assessment and Plan: Randall Jackson is a 58 y.o. male presenting with increasing dyspnea on exertion, orthopnea, cough, and leg swelling concerning for worsening CHF. PMH is significant for h/o MI with stent placement, CHF, and depression.    CHF exacerbation - History of MI approximately 9 years ago, s/p stent placement in LAD, was not taking statin, presenting with 3 months of worsening dyspnea, orthopnea, and lower extremity edema.  Was seen by cardiologist in November 2019 at which time an echo showed EF of 25%, re-stenting unable to be performed, and he was managed medically with statin, aspirin, beta-blocker, and spironolactone. He reports compliance with beta blocker only, despite not being able to tolerate it - unsure of his compliance with other outpatient medications as he reports they make him feel as though his "stomach is twisting".  He has been taking an unknown dose of lasix PRN and it is unclear who prescribed this, if anyone.  BNP in ED today was 954. Chart notes a previous BNP of 1,836 in December 2019.  Troponin I-stat was 0.04 in the ED. D-dimer elevated at 0.57 however this is age-appropriate when adjusted, and low suspicion for PE as vitals stable.   CXR showed pulmonary congestion consistent with likely CHF exacerbation.   On exam patient has no crackles, but does have lower extremity pitting edema.  Cardiology was consulted in the ED and recommended BB, spironolactone, and a trial of lasix. Given the patient's volume overloaded appearance and his current AKI, we will give one dose of IV lasix tonight and check creatinine and urine  output.  If not putting out urine and creatinine increases, we can consider giving some fluids, otherwise he will need to be diuresed for his CHF exacerbation.   - admit to inpatient, med telemetry.  Dr. Lum Babe attending.  - f/u cardiology consult, appreciate recs - trend trops - f/u repeat Echo - AM EKG - 40mg  IV lasix x1 tonight, check urine o/p and Cr in AM  - continue spironolactone, toprol XL,  lipitor - f/u risk stratification labs  - continuous cardiac monitor - continuous pulse ox - daily weights - strict I/O - PT/OT consult  -vitals per unit routine   V-tach - patient had a reported short run of V-tach in the ED.  Is not currently in any distress. Pt consistently tachycardic bw 100-120.  Will continue to monitor.   - continuous cardiac monitoring.   - AM EKG  AKI - Creatinine 1.38. Baseline readings in December were ~1.  Cardiology recommending trial of lasix.  Will give 40mg  IV and check Cr and urine output in AM.    FEN/GI: heart healthy diet Prophylaxis: lovenox  Disposition: telemetry  History of Present Illness:  Randall Jackson is a 58 y.o. male presenting with dyspnea on exertion x 3 months.  He had a MI 9 years ago with stents placed and stated he was doing well until 3 months ago.  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  He also states he is sleeping more upright. He does not complain of chest pain, other than after extensive coughing.  He quit smoking 4 months ago and has been coughing up a lot of mucous that has become more clear since quitting.  He denies his sputum being pink or red.  He states in November, after thanksgiving he went to [facility] and they tried to re-stent him but that it was 'blocked' and they decided to go with medical management of his heart failure.  He states his echo showed the EF was 25%.  They put him on a beta blocker, a hypertension medicine, and a statin.  He does not like  being on any of them.  He states he cannot eat or drink because of the statin due to nausea and stomach pain, described as 'stomach twisting', although he does not report vomiting, unless he forces himself to vomit.  He has been eating spinach, corn, ice cream, smoothies, and other soft foods. He spoke with a friend who advised he start lasix, and he states he then got a prescription for lasix from his doctor.  He takes this PRN.  He states this medicine also gives him stomach discomfort.  He first noticed leg swelling about 3-4 months ago when his symptoms started to appear.   Review Of Systems: Per HPI with the following additions:   Review of Systems  Constitutional: Positive for weight loss. Negative for chills and fever.  Respiratory: Positive for cough, sputum production and shortness of breath. Negative for hemoptysis.   Cardiovascular: Positive for orthopnea and leg swelling. Negative for chest pain.  Gastrointestinal: Positive for abdominal pain and nausea. Negative for diarrhea and vomiting.  Genitourinary: Positive for frequency.  Neurological: Negative for headaches.  Psychiatric/Behavioral: Negative for substance abuse.   Patient Active Problem List   Diagnosis Date Noted  . CHF (congestive heart failure) (HCC) 04/22/2018  . V-tach (HCC)   . AKI (acute kidney injury) (HCC)   . Depression 02/13/2015  . Cardiomyopathy, ischemic 06/05/2013  . Lumbar radiculopathy 06/26/2012  . Saddle anesthesia 06/26/2012  . Urinary incontinence 06/26/2012  . H/O right coronary artery stent placement 06/26/2012  . History of pulmonary artery stenosis 06/26/2012   Past Medical History: Past Medical History:  Diagnosis Date  . CAD (coronary artery disease)   . Cardiomyopathy, ischemic 06/05/2013   EF 26% April 2015, Dr. Lehman Prom @ [facility]   . Chest pain   . Depression 02/13/2015  . Dyspnea    WITH CRUSHING  . H/O right coronary artery stent placement 06/26/2012   March 2015: Dr. Lehman Prom  planning on lexiscan and possible cath for pre-op testing for Dr Yetta Barre' back surgery [facility])   . Heart attack (HCC)   . History of pulmonary artery stenosis 06/26/2012  . Hyperlipidemia   . Kidney stone   . Lumbar radiculopathy 06/26/2012   April 2014: Degenerative changes at L5-S1 with grade 1 spondylolisthesis as a result of bilateral L5 pars interarticularis defects. These changes result in moderate to advanced bilateral foraminal narrowing with deformity of the exiting bilateral L5 nerve roots..    . MI (myocardial infarction) (HCC)   . PVC (premature ventricular contraction)   . Saddle anesthesia 06/26/2012  . Sinus tachycardia   . SOB (shortness of breath) on exertion   . Tobacco use   . Urinary incontinence 06/26/2012   Past Surgical History: Past Surgical History:  Procedure Laterality Date  . cardiac stents    . KNEE SURGERY  1998  . stents placed  2011   Social History: Social History   Tobacco Use  . Smoking  status: Former Games developer  . Smokeless tobacco: Never Used  Substance Use Topics  . Alcohol use: Not Currently    Alcohol/week: 1.0 standard drinks    Types: 1 Standard drinks or equivalent per week  . Drug use: No   Additional social history: Lives at home, works for [employer].  Quit smoking 4 months ago cold [place], previously was smoking 1PPD x8 years. Denies alcoholic or illicit drug use.    Please also refer to relevant sections of EMR.  Family History: Family History  Problem Relation Age of Onset  . Diabetes Mother        father   Allergies and Medications: Allergies  Allergen Reactions  . Codeine Anaphylaxis and Swelling  . Gabapentin Other (See Comments)    Cognitive issues   No current facility-administered medications on file prior to encounter.    Current Outpatient Medications on File Prior to Encounter  Medication Sig Dispense Refill  . Ascorbic Acid (VITAMIN C) 1000 MG tablet Take 1,000 mg by mouth daily.    Marland Kitchen aspirin EC 325 MG  tablet Take 325 mg by mouth daily as needed (headaches).    Marland Kitchen aspirin EC 81 MG tablet Take 81 mg by mouth daily.    Marland Kitchen atorvastatin (LIPITOR) 80 MG tablet Take 80 mg by mouth at bedtime.    . Coenzyme Q10 (COQ10) 200 MG CAPS Take 200 mg by mouth daily.    . metoprolol succinate (TOPROL-XL) 25 MG 24 hr tablet Take 12.5 mg by mouth 2 (two) times daily.    . Multiple Vitamin (MULTIVITAMIN WITH MINERALS) TABS tablet Take 1 tablet by mouth daily. [employer] Mega Men    . Multiple Vitamins-Minerals (ZINC PO) Take 1 tablet by mouth daily as needed (shortness of breath).    . Papaya Enzyme CHEW Chew 3-4 tablets by mouth daily as needed (heartburn).    . pyridoxine (B-6) 200 MG tablet Take 200 mg by mouth daily.    Marland Kitchen spironolactone (ALDACTONE) 25 MG tablet Take 25 mg by mouth daily.    . vitamin E 1000 UNIT capsule Take 1,000 Units by mouth daily.    . carvedilol (COREG) 12.5 MG tablet Take 1 tablet (12.5 mg total) by mouth 2 (two) times daily with a meal. (Patient not taking: Reported on 04/22/2018) 60 tablet 3  . lisinopril (PRINIVIL,ZESTRIL) 5 MG tablet Take 1 tablet (5 mg total) by mouth daily. (Patient not taking: Reported on 04/22/2018) 90 tablet 3  . pravastatin (PRAVACHOL) 40 MG tablet Take 1 tablet (40 mg total) by mouth daily. (Patient not taking: Reported on 09/28/2015) 90 tablet 1  . topiramate (TOPAMAX) 100 MG tablet Take 1 tablet (100 mg total) by mouth 2 (two) times daily. (Patient not taking: Reported on 04/22/2018) 60 tablet 0   Objective: BP 111/88 (BP Location: Right Arm)   Pulse 77   Temp (!) 97.4 F (36.3 C) (Oral)   Resp 18   Ht 6' (1.829 m)   Wt 115.6 kg   SpO2 95%   BMI 34.57 kg/m    Exam: General: alert and oriented, no acute distress.  Eyes: PERRL, EOMI, no scleral icterus.  ENTM: moist oral mucosa, no oropharyngeal exudates or erythema.  Neck: no cervical LAD Cardiovascular: distant heart sounds, tachycardic, no murmur heard.  1+ radial pulses.  2+ pitting right, 1+ pedal edema on  the left.  Respiratory: lungs clear to auscultation bilaterally. No wheezing, no murmurs.  Patient was initially showing increased work of breathing on his left side,  but as he started talking his WOB improved.   Gastrointestinal: soft, nontender.  Normal bowel sounds.   MSK: strength equal bilaterally.   Derm: no rashes.  Lower legs mildly cooler to touch, but patient had them uncovered through most of the encounter.  Neuro: Cranial nerves grossly intact.   Psych: speech hurried, normal affect   Labs and Imaging: CBC BMET  Recent Labs  Lab 04/23/18 0334  WBC 11.3*  HGB 18.1*  HCT 54.2*  PLT 233   Recent Labs  Lab 04/23/18 0334  NA 138  K 3.8  CL 104  CO2 23  BUN 22*  CREATININE 1.19  GLUCOSE 127*  CALCIUM 8.6*       Lenor Coffin MD [facility] PGY-1   I have seen and evaluated the patient with Dr. Constance Goltz and agree with his documentation as above.  I have included my edits in blue.   Freddrick March, MD 04/23/2018, 7:50 AM [place], PGY-3

## 2018-04-23 ENCOUNTER — Encounter (HOSPITAL_COMMUNITY): Payer: Self-pay | Admitting: General Practice

## 2018-04-23 ENCOUNTER — Inpatient Hospital Stay (HOSPITAL_COMMUNITY): Payer: BLUE CROSS/BLUE SHIELD

## 2018-04-23 DIAGNOSIS — E785 Hyperlipidemia, unspecified: Secondary | ICD-10-CM

## 2018-04-23 DIAGNOSIS — I340 Nonrheumatic mitral (valve) insufficiency: Secondary | ICD-10-CM

## 2018-04-23 DIAGNOSIS — Z9114 Patient's other noncompliance with medication regimen: Secondary | ICD-10-CM

## 2018-04-23 DIAGNOSIS — I361 Nonrheumatic tricuspid (valve) insufficiency: Secondary | ICD-10-CM

## 2018-04-23 DIAGNOSIS — I5023 Acute on chronic systolic (congestive) heart failure: Secondary | ICD-10-CM

## 2018-04-23 DIAGNOSIS — R739 Hyperglycemia, unspecified: Secondary | ICD-10-CM

## 2018-04-23 LAB — BASIC METABOLIC PANEL
Anion gap: 11 (ref 5–15)
BUN: 22 mg/dL — ABNORMAL HIGH (ref 6–20)
CO2: 23 mmol/L (ref 22–32)
Calcium: 8.6 mg/dL — ABNORMAL LOW (ref 8.9–10.3)
Chloride: 104 mmol/L (ref 98–111)
Creatinine, Ser: 1.19 mg/dL (ref 0.61–1.24)
GFR calc Af Amer: >60 mL/min (ref >60)
GFR calc non Af Amer: >60 mL/min (ref >60)
Glucose, Bld: 127 mg/dL — ABNORMAL HIGH (ref 70–99)
Potassium: 3.8 mmol/L (ref 3.5–5.1)
Sodium: 138 mmol/L (ref 135–145)

## 2018-04-23 LAB — MAGNESIUM: Magnesium: 1.9 mg/dL (ref 1.7–2.4)

## 2018-04-23 LAB — ECHOCARDIOGRAM COMPLETE
Height: 72 in
Weight: 4078.4 oz

## 2018-04-23 LAB — TROPONIN I
Troponin I: 0.05 ng/mL (ref <0.03)
Troponin I: 0.04 ng/mL (ref <0.03)

## 2018-04-23 LAB — CBC
HCT: 54.2 % — ABNORMAL HIGH (ref 39.0–52.0)
Hemoglobin: 18.1 g/dL — ABNORMAL HIGH (ref 13.0–17.0)
MCH: 30.5 pg (ref 26.0–34.0)
MCHC: 33.4 g/dL (ref 30.0–36.0)
MCV: 91.2 fL (ref 80.0–100.0)
Platelets: 233 10*3/uL (ref 150–400)
RBC: 5.94 MIL/uL — ABNORMAL HIGH (ref 4.22–5.81)
RDW: 14.6 % (ref 11.5–15.5)
WBC: 11.3 10*3/uL — ABNORMAL HIGH (ref 4.0–10.5)
nRBC: 0 % (ref 0.0–0.2)

## 2018-04-23 LAB — HIV ANTIBODY (ROUTINE TESTING W REFLEX): HIV Screen 4th Generation wRfx: NONREACTIVE

## 2018-04-23 MED ORDER — POTASSIUM CHLORIDE CRYS ER 20 MEQ PO TBCR
20.0000 meq | EXTENDED_RELEASE_TABLET | Freq: Once | ORAL | Status: AC
  Administered 2018-04-23: 20 meq via ORAL
  Filled 2018-04-23: qty 1

## 2018-04-23 MED ORDER — LIVING BETTER WITH HEART FAILURE BOOK
Freq: Once | Status: AC
  Administered 2018-04-23: 12:00:00

## 2018-04-23 MED ORDER — FUROSEMIDE 40 MG PO TABS: 40.0000 mg | ORAL_TABLET | Freq: Two times a day (BID) | ORAL | Status: DC

## 2018-04-23 MED ORDER — ENOXAPARIN SODIUM 40 MG/0.4ML ~~LOC~~ SOLN
40.0000 mg | SUBCUTANEOUS | Status: DC
  Administered 2018-04-23 – 2018-04-24 (×2): 40 mg via SUBCUTANEOUS
  Filled 2018-04-23 (×2): qty 0.4

## 2018-04-23 MED ORDER — ROSUVASTATIN CALCIUM 20 MG PO TABS
40.0000 mg | ORAL_TABLET | Freq: Every day | ORAL | Status: DC
  Administered 2018-04-23 – 2018-04-27 (×5): 40 mg via ORAL
  Filled 2018-04-23 (×5): qty 2

## 2018-04-23 MED ORDER — PERFLUTREN LIPID MICROSPHERE
1.0000 mL | INTRAVENOUS | Status: AC | PRN
  Administered 2018-04-23: 2 mL via INTRAVENOUS
  Filled 2018-04-23: qty 10

## 2018-04-23 MED ORDER — MAGNESIUM SULFATE 2 GM/50ML IV SOLN
2.0000 g | Freq: Once | INTRAVENOUS | Status: AC
  Administered 2018-04-23: 2 g via INTRAVENOUS
  Filled 2018-04-23: qty 50

## 2018-04-23 MED ORDER — FUROSEMIDE 10 MG/ML IJ SOLN
40.0000 mg | Freq: Two times a day (BID) | INTRAMUSCULAR | Status: DC
  Filled 2018-04-23: qty 4

## 2018-04-23 MED ORDER — ROSUVASTATIN CALCIUM 20 MG PO TABS: 20.0000 mg | ORAL_TABLET | Freq: Every day | ORAL | Status: DC

## 2018-04-23 MED ORDER — FUROSEMIDE 10 MG/ML IJ SOLN: 40.0000 mg | Freq: Two times a day (BID) | INTRAMUSCULAR | Status: DC

## 2018-04-23 MED ORDER — FUROSEMIDE 10 MG/ML IJ SOLN
80.0000 mg | Freq: Two times a day (BID) | INTRAMUSCULAR | Status: DC
  Administered 2018-04-23 – 2018-04-24 (×3): 80 mg via INTRAVENOUS
  Filled 2018-04-23 (×3): qty 8

## 2018-04-23 MED ORDER — LOSARTAN POTASSIUM 25 MG PO TABS
12.5000 mg | ORAL_TABLET | Freq: Every day | ORAL | Status: DC
  Administered 2018-04-23 – 2018-04-24 (×2): 12.5 mg via ORAL
  Filled 2018-04-23 (×2): qty 1

## 2018-04-23 MED ORDER — DIGOXIN 125 MCG PO TABS
0.1250 mg | ORAL_TABLET | Freq: Every day | ORAL | Status: DC
  Administered 2018-04-23 – 2018-04-28 (×6): 0.125 mg via ORAL
  Filled 2018-04-23 (×6): qty 1

## 2018-04-23 NOTE — Progress Notes (Signed)
Notified by nursing that patient had a 10-beat run of NSVT. Pt was asymptomatic during this event. Labs were reviewed from this morning. Mg was 1.9, K 3.8. Will continue to monitor.   Roe Rutherford Anyelin Mogle, PA-C 04/23/2018, 7:22 PM

## 2018-04-23 NOTE — Progress Notes (Addendum)
Per [facility] pt had 10 beat run of v-tach at 1859.Pt asymptomatic.Strip printed and placed on patients chart.  [facility] paged.

## 2018-04-23 NOTE — Consult Note (Addendum)
Advanced Heart Failure Team Consult Note   Primary Physician: Laren Boom, DO PCP-Cardiologist:  No primary care provider on file.  Reason for Consultation: Heart Failure   HPI:    Randall Jackson is seen today for evaluation of heart failure at the request of Dr Antoine Poche.    Randall Jackson is a 58 y.o. male with a history of CAD s/p RCA stent 2011 with CTO RCA 2019, chronic systolic HF (EF 25-30%) , ICM, MI 2011,  MR, depression, former tobacco abuse quit 3 months ago, e, hyperlipidemia, and medications noncompliance.  For several years he did not take CAD meds.   Admitted to [facility] the end of November 2019 with increased dyspnea. Diuresed with IV lasix and later had LHC that showed CTO RCA. He discharged on asa, toprol xl, spiro, and atorvastatin. He has not had cardiology follow up. He has been taking toprol, aspirin, spiro, and statin.    He has had trouble with lipitor  due to nausea and stomach issues. He prefers vitamins and supplements. Work full time for [employer].   Presented to [facility] ED with increased dyspnea. In the ED he had short runs of NSVT. Placed on IV lasix. General cardiology consulted. CXR showed vascular congestion. Pertinent admission labs included: K 6.5, creatinine 1.38, BNP 954, WBC 13.6, Hgb 19.7 , TSH 6, and troponin 0.04. ECHO repeated today. So far he is negative 2 liters.   Today he was seen by general cardiology and due to concern for low output the Advanced HF Team was consulted.   Remains SOB with exertion. Denies chest pain.   Cath 2019 CTO RCA, mild disease circ and LAD. EF 25-30%  Cath 2011 BMS to RCA  Echo 10/11/09: EF 40-45%, mild MR, trace TR, RV normal Echo 12/2017: EF 25-30%, severe 3-4 DD, RV normal, mod-severe MR, mild TR  ECHO Pending today.   Review of Systems: [y] = yes,  = no   . General: Weight gain ; Weight loss ; Anorexia ; Fatigue [Y ]; Fever ; Chills ; Weakness   . Cardiac: Chest pain/pressure ;  Resting SOB ; Exertional SOB [Y ]; Orthopnea ; Pedal Edema [Y ]; Palpitations ; Syncope ; Presyncope ; Paroxysmal nocturnal dyspnea[ ]   . Pulmonary: Cough ; Wheezing[ ] ; Hemoptysis[ ] ; Sputum ; Snoring   . GI: Vomiting[ ] ; Dysphagia[ ] ; Melena[ ] ; Hematochezia ; Heartburn[ ] ; Abdominal pain ; Constipation ; Diarrhea ; BRBPR   . GU: Hematuria[ ] ; Dysuria ; Nocturia[ ]   . Vascular: Pain in legs with walking ; Pain in feet with lying flat ; Non-healing sores ; Stroke ; TIA ; Slurred speech ;  . Neuro: Headaches[ ] ; Vertigo[ ] ; Seizures[ ] ; Paresthesias[ ] ;Blurred vision ; Diplopia ; Vision changes   . Ortho/Skin: Arthritis ; Joint pain [Y ]; Muscle pain ; Joint swelling ; Back Pain ; Rash   . Psych: Depression[ ] ; Anxiety[ ]   . Heme: Bleeding problems ; Clotting disorders ; Anemia   . Endocrine: Diabetes ; Thyroid dysfunction[ ]   Home Medications Prior to Admission medications   Medication Sig Start Date End Date Taking? Authorizing Provider  Ascorbic Acid (VITAMIN C) 1000 MG tablet Take 1,000 mg by mouth daily.   Yes [provider]  aspirin EC  325 MG tablet Take 325 mg by mouth daily as needed (headaches).   Yes [provider]  aspirin EC 81 MG tablet Take 81 mg by mouth daily.   Yes [provider]  atorvastatin (LIPITOR) 80 MG tablet Take 80 mg by mouth at bedtime. 02/22/18  Yes [provider]  Coenzyme Q10 (COQ10) 200 MG CAPS Take 200 mg by mouth daily.   Yes [provider]  metoprolol succinate (TOPROL-XL) 25 MG 24 hr tablet Take 12.5 mg by mouth 2 (two) times daily. 01/22/18  Yes [provider]  Multiple Vitamin (MULTIVITAMIN WITH MINERALS) TABS tablet Take 1 tablet by mouth daily. [employer] Mega Men   Yes [provider]  Multiple Vitamins-Minerals (ZINC PO) Take 1 tablet by mouth daily as needed (shortness of breath).   Yes [provider]  Papaya Enzyme CHEW Chew 3-4 tablets by mouth daily as needed (heartburn).   Yes [provider]  pyridoxine (B-6) 200 MG tablet Take 200 mg by mouth daily.   Yes [provider]  spironolactone (ALDACTONE) 25 MG tablet Take 25 mg by mouth daily. 02/22/18  Yes [provider]  vitamin E 1000 UNIT capsule Take 1,000 Units by mouth daily.   Yes [provider]  carvedilol (COREG) 12.5 MG tablet Take 1 tablet (12.5 mg total) by mouth 2 (two) times daily with a meal. Patient not taking: Reported on 04/22/2018 02/13/15   Laren Boom, DO  lisinopril (PRINIVIL,ZESTRIL) 5 MG tablet Take 1 tablet (5 mg total) by mouth daily. Patient not taking: Reported on 04/22/2018 02/13/15   Laren Boom, DO  pravastatin (PRAVACHOL) 40 MG tablet Take 1 tablet (40 mg total) by mouth daily. Patient not taking: Reported on 09/28/2015 02/13/15   Laren Boom, DO  topiramate (TOPAMAX) 100 MG tablet Take 1 tablet (100 mg total) by mouth 2 (two) times daily. Patient not taking: Reported on 04/22/2018 02/13/15   Laren Boom, DO    Past Medical History: Past Medical History:  Diagnosis Date  . CAD (coronary artery disease)   . Cardiomyopathy, ischemic 06/05/2013   EF 26% April 2015, Dr. Lehman Prom @ [facility]   . Chest pain   . CHF (congestive heart failure) (HCC)   . Depression 02/13/2015  . Dyspnea    WITH CRUSHING  . H/O right coronary artery stent placement 06/26/2012   March 2015: Dr. Lehman Prom planning on lexiscan and possible cath for pre-op testing for Dr Yetta Barre' back surgery [facility])   . Heart attack (HCC)   . History of kidney stones   . History of pulmonary artery stenosis 06/26/2012  . Hyperlipidemia   . Lumbar radiculopathy 06/26/2012   April 2014: Degenerative changes at L5-S1 with grade 1 spondylolisthesis as a result of bilateral L5 pars interarticularis defects. These changes result in moderate to advanced bilateral foraminal narrowing with deformity of  the exiting bilateral L5 nerve roots..    . MI (myocardial infarction) (HCC)   . PVC (premature ventricular contraction)   . Saddle anesthesia 06/26/2012  . Sinus tachycardia   . SOB (shortness of breath) on exertion   . Tobacco use   . Urinary incontinence 06/26/2012    Past Surgical History: Past Surgical History:  Procedure Laterality Date  . cardiac stents    . KNEE SURGERY  1998  . stents placed  2011    Family History: Family History  Problem Relation Age of Onset  . Diabetes Mother        father  Social History: Social History   Socioeconomic History  . Marital status: Married    Spouse name: Not on file  . Number of children: Not on file  . Years of education: Not on file  . Highest education level: Not on file  Occupational History  . Not on file  Social Needs  . Financial resource strain: Not on file  . Food insecurity:    Worry: Not on file    Inability: Not on file  . Transportation needs:    Medical: Not on file    Non-medical: Not on file  Tobacco Use  . Smoking status: Former Games developer  . Smokeless tobacco: Never Used  Substance and Sexual Activity  . Alcohol use: Not Currently    Alcohol/week: 1.0 standard drinks    Types: 1 Standard drinks or equivalent per week  . Drug use: No  . Sexual activity: Not Currently  Lifestyle  . Physical activity:    Days per week: Not on file    Minutes per session: Not on file  . Stress: Not on file  Relationships  . Social connections:    Talks on phone: Not on file    Gets together: Not on file    Attends religious service: Not on file    Active member of club or organization: Not on file    Attends meetings of clubs or organizations: Not on file    Relationship status: Not on file  Other Topics Concern  . Not on file  Social History Narrative  . Not on file    Allergies:  Allergies  Allergen Reactions  . Codeine Anaphylaxis and Swelling  . Gabapentin Other (See Comments)    Cognitive issues     Objective:    Vital Signs:   Temp:  [97.4 F (36.3 C)-98.7 F (37.1 C)] 97.4 F (36.3 C) (03/02 1244) Pulse Rate:  [48-117] 92 (03/02 1244) Resp:  [15-28] 19 (03/02 1244) BP: (90-135)/(36-96) 98/72 (03/02 1244) SpO2:  [93 %-98 %] 98 % (03/02 1244) Weight:  [115.6 kg-117.9 kg] 115.6 kg (03/02 0414) Last BM Date: 04/22/18  Weight change: Filed Weights   04/22/18 1633 04/22/18 2143 04/23/18 0414  Weight: 117.9 kg 117.8 kg 115.6 kg    Intake/Output:   Intake/Output Summary (Last 24 hours) at 04/23/2018 1355 Last data filed at 04/23/2018 0729 Gross per 24 hour  Intake 240 ml  Output 2875 ml  Net -2635 ml      Physical Exam    General:  No resp difficulty HEENT: normal Neck: supple. JVP to jaw . Carotids 2+ bilat; no bruits. No lymphadenopathy or thyromegaly appreciated. Cor: PMI nondisplaced. Regular rate & rhythm. No rubs, gallops or murmurs. Lungs: clear on room air.  Abdomen: soft, nontender, nondistended. No hepatosplenomegaly. No bruits or masses. Good bowel sounds. Extremities: no cyanosis, clubbing, rash, R and LLE cool 1+ edema Neuro: alert & orientedx3, cranial nerves grossly intact. moves all 4 extremities w/o difficulty. Affect pleasant   Telemetry   SR with NSVT 90s   EKG   SR QRS 104 ms   Labs   Basic Metabolic Panel: Recent Labs  Lab 04/22/18 1620 04/22/18 1800 04/23/18 0334  NA 133*  --  138  K 6.5* 4.5 3.8  CL 104  --  104  CO2 18*  --  23  GLUCOSE 140*  --  127*  BUN 28*  --  22*  CREATININE 1.38*  --  1.19  CALCIUM 8.4*  --  8.6*  MG  --   --  1.9    Liver Function Tests: No results for input(s): AST, ALT, ALKPHOS, BILITOT, PROT, ALBUMIN in the last 168 hours. No results for input(s): LIPASE, AMYLASE in the last 168 hours. No results for input(s): AMMONIA in the last 168 hours.  CBC: Recent Labs  Lab 04/22/18 1620 04/23/18 0334  WBC 13.6* 11.3*  NEUTROABS 8.0*  --   HGB 18.7* 18.1*  HCT 57.1* 54.2*  MCV 92.2 91.2  PLT  288 233    Cardiac Enzymes: Recent Labs  Lab 04/22/18 2152 04/23/18 0334 04/23/18 0922  TROPONINI 0.05* 0.05* 0.04*    BNP: BNP (last 3 results) Recent Labs    04/22/18 1620  BNP 954.3*    ProBNP (last 3 results) No results for input(s): PROBNP in the last 8760 hours.   CBG: No results for input(s): GLUCAP in the last 168 hours.  Coagulation Studies: No results for input(s): LABPROT, INR in the last 72 hours.   Imaging   Dg Chest Port 1 View  Result Date: 04/22/2018 CLINICAL DATA:  Acute shortness of breath and chest pain. EXAM: PORTABLE CHEST 1 VIEW COMPARISON:  None. FINDINGS: Cardiomegaly and pulmonary vascular congestion noted. There is no evidence of focal airspace disease, pulmonary edema, suspicious pulmonary nodule/mass, pleural effusion, or pneumothorax. No acute bony abnormalities are identified. IMPRESSION: Cardiomegaly with pulmonary vascular congestion. Electronically Signed   By: Harmon Pier M.D.   On: 04/22/2018 17:20      Medications:     Current Medications: . aspirin EC  81 mg Oral Daily  . enoxaparin (LOVENOX) injection  40 mg Subcutaneous Q24H  . furosemide  40 mg Oral BID  . metoprolol succinate  12.5 mg Oral BID  . rosuvastatin  40 mg Oral q1800  . sodium chloride flush  3 mL Intravenous Q12H  . spironolactone  25 mg Oral Daily     Infusions: . sodium chloride         Patient Profile  Randall Jackson is a 58 y.o. male with a history of CAD s/p RCA stent, chronic systolic HF (EF 25-30%) , ICM. MI 2011,  MR, depression, tobacco use, hyperlipidemia, and medications noncompliance.  For several years he did not take CAD meds.   Admitted with A/C systolic HF.   Assessment/Plan   1. A/C Systolic Heart Failure , ICM  ECHO 2019 Ef 20~25% . Repeat ECHO today EF 20-25%  BNP elevated on admit . Remains volume overloaded. Increase lasix to 80 mg IV twice a day.  -Continue 12.5 mg toporol xl twice a day. Add digoxin 0.125 mg daily. -Add  12.5 mg losartan daily.  -Continue 25 mg spironolactone daily.  - Follow renal function daily.  -He will need RHC.   2. CAD MI 2011 with BMS to RCA, LHC 2019 CTO RCA Troponin 0.05>0.05>0.04 - no s/s ischemia  -Continue asa and statin.   3.MR  Moderate on Dr Alford Highland review of today's echo.   4. Elevated TSH -Check free T3, T4  5. Noncompliance  -Discussed the importance of HF medications.  6. Snores -He will need outpatient sleep study.    7. NSVT Brief NSVT. Watch K and Mag Give 2 grams Mag now.   Biggest concern will be ongoing medications for HF. Encouraged to follow HF Team recommendations. Refer to cardiac rehab.   Length of Stay: 1  Amy Clegg NP-C  3:22 PM  04/23/2018, 1:55 PM  Advanced Heart Failure Team Pager [phone] (M-F; 7a - 4p)  Please contact [facility] for night-coverage  after hours (4p -7a ) and weekends on [email or web address]   Patient seen with NP, agree with the above note.   Patient has been progressive short of breath for the last few weeks.  History of remote MI, cath in 12/19 showed CTO RCA.  Echo reviewed today, EF 20-25%, moderate LV dilation, diffuse hypokinesis.   On exam, JVP 14-16 cm.  Regular S1S2, no S3.  Mild crackles at bases.  1+ ankle edema.   1. Acute on chronic systolic CHF: Primarily ischemic cardiomyopathy. Recent cath in 12/19 with CTO RCA, minimal disease in the LAD and LCx.  Echo today was reviewed, shows EF 20-25% with moderate dilation and diffuse hypokinesis, moderate MR, mildly decreased RV systolic function.  Echo with diffuse hypokinesis suggests either a mixed cardiomyopathy or substantial negative remodeling post-RCA occlusion. He has never been a heavy drinker.  On exam, he remains significantly volume overloaded and is orthopneic.  - Lasix 80 mg IV bid, replace K and Mg.  - Toprol XL 12.5 mg bid.  - Add digoxin 0.125.  - Add losartan 12.5 daily.  - Continue spironolactone 25 daily.  - After diuresis, will arrange RHC to  assess filling pressures and cardiac output.  - Will need repeat echo in 3+ months, if EF remains low, would arrange for ICD (narrow QRS, not CRT candidate).  2. CAD: History of inferior MI with RCA in 2011, found to have RCA CTO on cath in 12/19 with minimal disease in LAD and LCx.   - Continue Crestor 40 mg daily.  - Continue ASA 81 daily.  3. NSVT: Keep K and Mg repleted, continue low dose Toprol XL.  4. Noncompliance: He has been reticent to take prescription meds in the past.  We discussed the importance of medication compliance today.   Marca Ancona 04/23/2018 3:35 PM

## 2018-04-23 NOTE — Progress Notes (Signed)
OT Cancellation Note  Patient Details Name: Randall Jackson MRN: [id number] DOB: 06-May-1960   Cancelled Treatment:    Reason Eval/Treat Not Completed: Patient at procedure or test/ unavailable. Pt off unit to ECHO, will check back next available/appropriate time  Galen Manila 04/23/2018, 2:06 PM

## 2018-04-23 NOTE — Evaluation (Signed)
Physical Therapy Evaluation Patient Details Name: Randall Jackson MRN: [id number] DOB: 1960-04-15 Today's Date: 04/23/2018   History of Present Illness  58 y.o. male CAD with NSTEMI/BMS x2 to RCA in 2011, post-cath pericardial hermatoma, prior noncompliance, ICM/chronic combined CHF, mitral regurgitation, moderate-severe pulmary HTN, HTN, elevated LFTs, HLD who presented to [facility] with worsening CHF.  Clinical Impression  Pt admitted with above diagnosis. Pt currently with functional limitations due to the deficits listed below (see PT Problem List).  O2 sat at rest on RA was 95% but would desat to 87% when talking. Ambulated to hallway and hadn't gone 40 feet before he was dyspneic and had to stop and take standing rest break.  Cues for pursed lip breathing.  Pt was able to incr sats with  purse lip breathe to >90%. Desat to 74-85% while walking with pt DOE 4/4.  Even after pt laidback down on bed, struggling to breathe for 3 min and desat to 84% and then up to 90's on RA.   Will follow acutely. Pt will benefit from skilled PT to increase their independence and safety with mobility to allow discharge to the venue listed below.      Follow Up Recommendations No PT follow up;Supervision - Intermittent    Equipment Recommendations  None recommended by PT    Recommendations for Other Services       Precautions / Restrictions Precautions Precautions: Fall Restrictions Weight Bearing Restrictions: No      Mobility  Bed Mobility Overal bed mobility: Independent                Transfers Overall transfer level: Independent                  Ambulation/Gait Ambulation/Gait assistance: Supervision Gait Distance (Feet): 80 Feet Assistive device: None Gait Pattern/deviations: Step-through pattern;Decreased stride length   Gait velocity interpretation: 1.31 - 2.62 ft/sec, indicative of limited community ambulator General Gait Details: O2 sat at rest on RA was 95% but would desat  to 87% when talking. Ambulated to hallway and hadn't gone 40 feet before he was dyspneic and had to stop and take standing rest break.  Cues for pursed lip breathing.  Pt was able to incr sats with  purse lip breathe to >90%. Desat to 74-85% while walking with pt DOE 4/4.  Even after pt laidback down on bed, struggling to breathe for 3 min and desat to 84% and then up to 90's on RA. .    Stairs            Wheelchair Mobility    Modified Rankin (Stroke Patients Only)       Balance Overall balance assessment: Independent                                           Pertinent Vitals/Pain Pain Assessment: No/denies pain    Home Living Family/patient expects to be discharged to:: Private residence Living Arrangements: Spouse/significant other Available Help at Discharge: Family;Available PRN/intermittently(wife works, pt works full time as well) Type of Home: [employer] Access: Stairs to enter Entrance Stairs-Rails: Doctor, general practice of Steps: 6 Home Layout: One level Home Equipment: Gilmer Mor - single point      Prior Function Level of Independence: Independent         Comments: Driving and working PTA     Hand Dominance   Dominant Hand: Right  Extremity/Trunk Assessment   Upper Extremity Assessment Upper Extremity Assessment: Defer to OT evaluation    Lower Extremity Assessment Lower Extremity Assessment: Generalized weakness    Cervical / Trunk Assessment Cervical / Trunk Assessment: Normal  Communication   Communication: No difficulties  Cognition Arousal/Alertness: Awake/alert Behavior During Therapy: Flat affect;WFL for tasks assessed/performed Overall Cognitive Status: Within Functional Limits for tasks assessed                                        General Comments      Exercises     Assessment/Plan    PT Assessment Patient needs continued PT services  PT Problem List Decreased activity  tolerance;Decreased balance;Decreased mobility;Decreased knowledge of use of DME;Decreased safety awareness;Decreased knowledge of precautions;Cardiopulmonary status limiting activity       PT Treatment Interventions DME instruction;Gait training;Stair training;Therapeutic activities;Functional mobility training;Therapeutic exercise;Balance training;Patient/family education    PT Goals (Current goals can be found in the Care Plan section)  Acute Rehab PT Goals Patient Stated Goal: to quit being SOB PT Goal Formulation: With patient Time For Goal Achievement: 05/07/18 Potential to Achieve Goals: Good    Frequency Min 3X/week   Barriers to discharge        Co-evaluation               AM-PAC PT "6 Clicks" Mobility  Outcome Measure Help needed turning from your back to your side while in a flat bed without using bedrails?: None Help needed moving from lying on your back to sitting on the side of a flat bed without using bedrails?: None Help needed moving to and from a bed to a chair (including a wheelchair)?: None Help needed standing up from a chair using your arms (e.g., wheelchair or bedside chair)?: None Help needed to walk in hospital room?: A Little Help needed climbing 3-5 steps with a railing? : A Little 6 Click Score: 22    End of Session Equipment Utilized During Treatment: Gait belt Activity Tolerance: Patient limited by fatigue Patient left: in bed;with call bell/phone within reach Nurse Communication: Mobility status PT Visit Diagnosis: Muscle weakness (generalized) (M62.81)    Time: 1245-8099 PT Time Calculation (min) (ACUTE ONLY): 14 min   Charges:   PT Evaluation $PT Eval Moderate Complexity: 1 Mod          Latori Beggs,PT Acute Rehabilitation Services Pager:  [phone]  Office:  [phone]    [name] 04/23/2018, 2:26 PM

## 2018-04-23 NOTE — Progress Notes (Signed)
Family Medicine Teaching Service Daily Progress Note Intern Pager: [phone]  Patient name: Randall Jackson Medical record number: [id number] Date of birth: June 06, 1960 Age: 58 y.o. Gender: male  Primary Care Provider: Laren Boom, DO Consultants: cardiology Code Status: full  Pt Overview and Major Events to Date:  3/1 - admitted for decompensated heart failure  Assessment and Plan: Rae Pivirotto is a 58 y.o. male presenting with increasing dyspnea on exertion, orthopnea, cough, and leg swelling concerning for worsening CHF . PMH is significant for MI, CHF,   Acute decompensated heart failure, with reduced ejection fraction  Vitals overnight remarkable for decrease of the tachycardia and tachypnea noted on admission.  Respiratory and heart rate now within normal limits.  Troponin stable at 0.05 overnight.  Done 2.3 L from admission.  Weight is down 2.3 kg from admission. -Follow-up echo -Change to p.o. Lasix 40 mg twice daily -Continue spironolactone -Continue Toprol-XL -Strict I's and O's -Daily weights  Nonsustained ventricular tachycardia 1 of event of 8 beats of ventricular tachycardia noted overnight. -Continue cardiac monitors  CAD Risk stratification labs show total cholesterol of 112, LDL 71.  No adjustment necessary on a statin regimen. -Lipitor  AKI Baseline creatinine roughly 1.0.  Admission creatinine 1.38 now downtrending to 1.19.  BUN/creatinine ratio greater than 22 1 indicating likely prerenal etiology-likely cardiogenic. -Monitor with daily's BMP  Concern for hypothyroidism TSH from a stratification labs noted to be 6.202 -Follow-up T3/T4  FEN/GI: Heart healthy diet PPx: Lovenox, Zofran PRN  Disposition:  Possible DC home 3/3  Subjective:  He reports mild improvement from his shortness of breath on admission.  He has no complaints of chest pain or abdominal pain today.  No new symptoms/complaints overnight.  Objective: Temp:  [97.4 F (36.3 C)-98.7  F (37.1 C)] 97.4 F (36.3 C) (03/02 0414) Pulse Rate:  [48-117] 77 (03/02 0414) Resp:  [15-28] 18 (03/02 0414) BP: (90-135)/(36-96) 111/88 (03/02 0414) SpO2:  [93 %-98 %] 95 % (03/02 0414) Weight:  [115.6 kg-117.9 kg] 115.6 kg (03/02 0414)  Physical Exam: General: Alert and cooperative and appears to be in no acute distress HEENT: JVD to angle of jaw Cardio: Normal S1 and S2, no S3 or S4. Rhythm is regular. No murmurs or rubs.   Pulm: Rales appreciated in right lower lung field.  No wheezing, or diminished breath sounds. Normal respiratory effort on room air. Abdomen: Bowel sounds normal. Abdomen soft and non-tender.  Extremities: 2+ pitting edema warm/ well perfused.  Strong radial pulse  neuro: Cranial nerves grossly intact  Laboratory: Recent Labs  Lab 04/22/18 1620 04/23/18 0334  WBC 13.6* 11.3*  HGB 18.7* 18.1*  HCT 57.1* 54.2*  PLT 288 233   Recent Labs  Lab 04/22/18 1620 04/22/18 1800 04/23/18 0334  NA 133*  --  138  K 6.5* 4.5 3.8  CL 104  --  104  CO2 18*  --  23  BUN 28*  --  22*  CREATININE 1.38*  --  1.19  CALCIUM 8.4*  --  8.6*  GLUCOSE 140*  --  127*      Imaging/Diagnostic Tests: Dg Chest Port 1 View  Result Date: 04/22/2018 CLINICAL DATA:  Acute shortness of breath and chest pain. EXAM: PORTABLE CHEST 1 VIEW COMPARISON:  None. FINDINGS: Cardiomegaly and pulmonary vascular congestion noted. There is no evidence of focal airspace disease, pulmonary edema, suspicious pulmonary nodule/mass, pleural effusion, or pneumothorax. No acute bony abnormalities are identified. IMPRESSION: Cardiomegaly with pulmonary vascular congestion. Electronically Signed   By:  Harmon Pier M.D.   On: 04/22/2018 17:20     Mirian Mo, MD 04/23/2018, 6:22 AM PGY-1, [facility] Family Medicine FPTS Intern pager: [phone], text pages welcome

## 2018-04-23 NOTE — Progress Notes (Addendum)
Progress Note  Patient Name: Randall Jackson Date of Encounter: 04/23/2018  Primary Cardiologist: No primary care provider on file. - seen by Dr. Bonnita Hollow as OP, was pending new patient eval 3/11 with Dr. Jens Som  Subjective   He is feeling better this AM. Has been feeling orthopnea/abd discomfort for several weeks along with cough. He is -2.6L overnight. His wife has told him over the past 2 weeks she thought he was dying.  Hx of noncompliance noted - he really seems to struggle with not wanting to take prescription medications, yet acknowledges that if he does not begin treating his heart disease he will likely do poorly.  Patient relays significant frustration with [facility] team, citing that no one ever explained to him about his heart diagnoses. He relays significant fear over prescription medicines due to reading on the internet about how awful they are for you and how many side effects there are. He believes the Lipitor has caused him to have stomach twisting/nausea and requests to try Crestor instead. He reports after his first MI he elected not to take any medications and instead went to the health food store and got red yeast rice and feels like this did him well since he got 9 years out of his stent. However, he is now frustrated that the cathing doctor told him at [facility] there was nothing they could do about his progressive CAD (CTO - see note below). He was also upset that he had to request an rx for Lasix based on conversation with a friend who also has CHF.  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  Inpatient Medications    Scheduled Meds: . aspirin EC  81 mg Oral Daily  . atorvastatin  80 mg Oral QHS  . enoxaparin (LOVENOX) injection  30 mg Subcutaneous Q24H  . Living Better with Heart Failure Book   Does not apply Once  . metoprolol succinate  12.5 mg Oral BID  . sodium  chloride flush  3 mL Intravenous Q12H  . spironolactone  25 mg Oral Daily   Continuous Infusions: . sodium chloride     PRN Meds: sodium chloride, acetaminophen **OR** acetaminophen, ondansetron **OR** ondansetron (ZOFRAN) IV, polyethylene glycol, sodium chloride flush   Vital Signs    Vitals:   04/22/18 2143 04/23/18 0036 04/23/18 0414 04/23/18 0933  BP: (!) 135/96 90/64 111/88 104/84  Pulse: (!) 107 91 77 99  Resp: Temp: 98.6 F (37 C) 97.6 F (36.4 C) (!) 97.4 F (36.3 C) 98 F (36.7 C)  TempSrc:   Oral Oral  SpO2: 95% 96% 95% 97%  Weight: 117.8 kg  115.6 kg   Height: 6' (1.829 m)       Intake/Output Summary (Last 24 hours) at 04/23/2018 1010 Last data filed at 04/23/2018 0729 Gross per 24 hour  Intake 240 ml  Output 2875 ml  Net -2635 ml   Last 3 Weights 04/23/2018 04/22/2018 04/22/2018  Weight (lbs) 254 lb 14.4 oz 259 lb 12.8 oz 260 lb  Weight (kg) 115.622 kg 117.845 kg 117.935 kg     Telemetry    NSR with brief run NSVT yesterday, also run of ventricular bigeminy - Personally Reviewed  ECG    NSR low voltage QRS QTc - Personally Reviewed  Physical Exam   GEN: No acute distress.  HEENT: Normocephalic, atraumatic, sclera non-icteric. Neck: No JVD or  bruits. Cardiac: RRR no murmurs, rubs, or gallops.  Radials/DP/PT 1+ and equal bilaterally.  Respiratory: Diminished BS at bases. No rales/wheezes or rhonchi. Breathing is unlabored. GI: Soft, nontender, non-distended, BS +x 4. MS: no deformity. Extremities: No clubbing or cyanosis. Trace BLE edema. Distal pedal pulses are 2+ and equal bilaterally. Neuro:  AAOx3. Follows commands. Psych:  Responds to questions appropriately with a normal affect.  Labs    Chemistry Recent Labs  Lab 04/22/18 1620 04/22/18 1800 04/23/18 0334  NA 133*  --  138  K 6.5* 4.5 3.8  CL 104  --  104  CO2 18*  --  23  GLUCOSE 140*  --  127*  BUN 28*  --  22*  CREATININE 1.38*  --  1.19  CALCIUM 8.4*  --  8.6*    GFRNONAA 57*  --  >60  GFRAA >60  --  >60  ANIONGAP 11  --  11     Hematology Recent Labs  Lab 04/22/18 1620 04/23/18 0334  WBC 13.6* 11.3*  RBC 6.19* 5.94*  HGB 18.7* 18.1*  HCT 57.1* 54.2*  MCV 92.2 91.2  MCH 30.2 30.5  MCHC 32.7 33.4  RDW 14.9 14.6  PLT 288 233    Cardiac Enzymes Recent Labs  Lab 04/22/18 2152 04/23/18 0334  TROPONINI 0.05* 0.05*    Recent Labs  Lab 04/22/18 1906  TROPIPOC 0.04     BNP Recent Labs  Lab 04/22/18 1620  BNP 954.3*     DDimer  Recent Labs  Lab 04/22/18 1800  DDIMER 0.57*     Radiology    Dg Chest Port 1 View  Result Date: 04/22/2018 CLINICAL DATA:  Acute shortness of breath and chest pain. EXAM: PORTABLE CHEST 1 VIEW COMPARISON:  None. FINDINGS: Cardiomegaly and pulmonary vascular congestion noted. There is no evidence of focal airspace disease, pulmonary edema, suspicious pulmonary nodule/mass, pleural effusion, or pneumothorax. No acute bony abnormalities are identified. IMPRESSION: Cardiomegaly with pulmonary vascular congestion. Electronically Signed   By: Harmon Pier M.D.   On: 04/22/2018 17:20    Cardiac Studies   2D Echo Result Date: 01/20/2018 [facility] [address] +-----------------+ +------------------------------------+ [facility]: : : [address],: : : : [place] [address] : : : : [phone] : : : Fax [phone] : +------------------------------------+ [phone] : : +-----------------+ Echocardiogram Report +--------------------------------------------------------------------------+ :Name: Nadara Mode Study Date: 01/20/2018 10:34 AM : :Patient Location: [facility] [id number] BP: 130/88 mmHg : :MRN: [id number] : :DOB: 06/18/1960 Height: 72 in : :Gender: Male Weight: 258 lb : :Race: W,WHITE,White or Caucasian : :Age: 76 yrs BSA: 2.4 m2 : :Reason For Study: Shortness of Breath : :History: SMOKER, CAD, HLD, ICM, MI, CHF, NEAR SYNCOPE, HTN, FORMER SMOKER,: :Referring Physician: Laren Boom, M.D. :  :Performed By: Arturo Morton, BS, RDCS : +--------------------------------------------------------------------------+ Interpretation Summary A complete portable two-dimensional transthoracic echocardiogram with color flow Doppler and Spectral Doppler was performed. The study was technically difficult. The left ventricle is moderately dilated. There is mild concentric left ventricular hypertrophy. The left atrium is mildly dilated. The left ventricular ejection fraction is markedly reduced (25-30%). There is severe diffuse hypokinesis of the left ventricle. There is mild (1+) tricuspid regurgitation. Right ventricular systolic pressure is elevated between 50-3mm Hg, consistent with moderately severe pulmonary hypertension. There is moderate to moderately severe (2-3+) mitral regurgitation. Dilated inferior vena cava suggests increased right atrial pressure. Left Ventricle The left ventricle is moderately dilated. There is mild concentric left ventricular hypertrophy. The left  ventricular ejection fraction is markedly reduced (25-30%). There is severe diffuse hypokinesis of the left ventricle. There is severe (Grade III/IV) diastolic dysfunction; reversible restrictive pattern of mitral inflow. Right Ventricle The right ventricle is grossly normal in size and function. Atria The left atrium is mildly dilated. The right atrium is normal. Dilated inferior vena cava suggests increased right atrial pressure. Mitral Valve The mitral valve is grossly normal. There is moderate to moderately severe (2-3+) mitral regurgitation. Tricuspid Valve The tricuspid valve is grossly normal. There is mild (1+) tricuspid regurgitation.Right ventricular systolic pressure is elevated between 50- 29mm Hg, consistent with moderately severe pulmonary hypertension. Aortic Valve The aortic valve is trileaflet. The aortic valve opens well. There is no aortic stenosis. There is no aortic regurgitation present. Pulmonic Valve The pulmonic valve  is not well visualized, unable to adequately assess function. Vessels The aortic root is normal in diameter. Pericardium There is no pericardial effusion. MMode/2D Measurements & Calculations IVSd: 1.1 cm LVIDd: 6.7 cm LVIDs: 6.0 cm LVPWd: 1.1 cm _____________________________________________________________ LV mass(C)d: 339.6 grams Ao root diam: 3.2 cm LV mass(C)dI: 143.1 grams/m2 Ao root area: 7.9 cm2 LA dimension: 5.6 cm _____________________________________________________________ LAV (MOD-bp) Index: 33.9 ml/m2 LAV(MOD-bp): 80.5 ml Doppler Measurements & Calculations MV E max vel: 114.0 cm/sec MV dec slope: 812.8 cm/sec2 MV dec time: 0.14 sec _____________________________________________________________ Ao V2 max: 117.4 cm/sec MR max vel: 453.9 cm/sec Ao max PG: 5.5 mmHg MR max PG: 82.4 mmHg _____________________________________________________________ TR max vel: 339.3 cm/sec RAP systole: 8.0 mmHg TR max PG: 46.1 mmHg RVSP(TR): 54.1 mmHg ____________________________________________________________________________ Electronically signed [id number] I Friedman on 01/20/2018 12:58 PM Ordering Physician: [id number]^^^^^[id number]   Patient Profile     58 y.o. male CAD with NSTEMI/BMS x2 to RCA in 2011, post-cath pericardial hermatoma, prior noncompliance, ICM/chronic combined CHF, mitral regurgitation, moderate-severe pulmary HTN, HTN, elevated LFTs, HLD who presented to [facility] with worsening CHF.  He was admitted 12/2017 at outside hospital [facility]) with acute on chronic CHF and recurrent drop in EF. Notes outline he had had poor compliance after initial MI 2011, with initially recovered EF but drop to 25-30% in 12/2017. Cardiac cath 01/22/18 showed 100% distal RCA,100% CTO and ISR of PLB, 50% distal RCA, EF 20-30%, mild wall irregularities of the LAD/Cx, unsuccessful attempt to cross the PLB CTO -> medical management recommended with BB, ACE, spiro. Notes outline the patient traditionally does not  like to take prescription medication, instead opting for herbal therapies. He was admitted with worsening orthopnea, DOE, cough, and leg swelling with acute on chronic CHF. Also had run of NSVT in the ED.  Assessment & Plan    1. Acute on chronic combined CHF with moderate-severe MR and pulmonary HTN by echo 12/2017, ? mixed ICM/NICM - difficult situation, made challenging by patient's prior avoidance of prescription medicines. He seems to really struggle with accepting the idea of guideline directed management but also expresses a desire to get well. He did well with 1 dose of IV Lasix, could probably initiate oral form today. He is on a baby dose of Toprol and spironolactone 25mg . His blood pressure traditionally tends to be on the softer side so it's not clear he would tolerate Entresto, but consideration could be given to low dose ARB.  Need to consider EP eval for NSVT.  2. NSVT -  We will need to address this but his is not there psychologically yet.   3. Mildly elevated troponin - suspect demand ischemia. Recent cath 12/2017 as outlined above. Continue ASA, BB,  statin.  4. HLD - LDL 71. Pt declines to take atorvastatin. Will switch to Crestor - start moderate dose instead of high intensity (20mg ) given his history of intolerance.  5. Initial labs showing AKI, hyperkalemia (likely lab error) - improved this AM. Hyperkalemia was likely 2/2 hemolysis.  6. Abnormal TSH - further eval per IM.  7. Hyperglycemia with A1C 6.5 likely representing newly diagnosed diabetes mellitus - I did not discuss with patient today as I will defer further review/management to primary team.  For questions or updates, please contact [facility] Please consult [email or web address] for contact info under Cardiology/STEMI.  Signed, Laurann Montana, PA-C 04/23/2018, 10:10 AM    History and all data above reviewed.  Patient examined.  I agree with the findings as above. I supplemented the discussion from Ronie Spies, [facility]  with a long discussion of my own. We discussed the nature and natural history of his condition and the need for compliance.  He is breathing better but is still quite dyspneic with minimal movement around the room.   The patient exam reveals COR:RRR  ,  Lungs: Decreased breath sounds without overt crackles,  Neck JVD 10 cm at 45 degrees  ,  Abd: Positive bowel sounds, no rebound no guarding, Ext Mild edema  .  All available labs, radiology testing, previous records reviewed. Agree with documented assessment and plan.   Acute on chronic systolic HF:  Hypervolemic and hypotensive.  I am going to continue with IV diuresis.  Would likely benefit from right heart cath this admission and I will discuss the Advanced HF.  I suspect a significantly reduced CO and med titration will have to be finessed.  Discussed salt and fluid limits and compliance musts.    Fayrene Fearing Andreanna Mikolajczak  11:21 AM  04/23/2018

## 2018-04-23 NOTE — Plan of Care (Signed)
  Problem: Clinical Measurements: Goal: Will remain free from infection Outcome: Progressing   Problem: Activity: Goal: Risk for activity intolerance will decrease Outcome: Progressing   Problem: Elimination: Goal: Will not experience complications related to bowel motility Outcome: Progressing   Problem: Safety: Goal: Ability to remain free from injury will improve Outcome: Progressing   Problem: Activity: Goal: Capacity to carry out activities will improve Outcome: Progressing   

## 2018-04-23 NOTE — Progress Notes (Signed)
I spoke with Amy with CHF team to request consult.   Per discussion with Dr. Antoine Poche, I also cancelled 3/11 new pt appt with Dr. Jens Som since pt is admitted and will end up needing to r/s follow-up (possibly with HF team). I wrote care order for nurse to make pt aware.  Dayna Dunn PA-C

## 2018-04-23 NOTE — Progress Notes (Signed)
  Echocardiogram 2D Echocardiogram has been performed.  Janene Yousuf L Androw 04/23/2018, 2:20 PM

## 2018-04-24 DIAGNOSIS — I5043 Acute on chronic combined systolic (congestive) and diastolic (congestive) heart failure: Secondary | ICD-10-CM

## 2018-04-24 LAB — CBC
HCT: 52.6 % — ABNORMAL HIGH (ref 39.0–52.0)
Hemoglobin: 17.4 g/dL — ABNORMAL HIGH (ref 13.0–17.0)
MCH: 30.4 pg (ref 26.0–34.0)
MCHC: 33.1 g/dL (ref 30.0–36.0)
MCV: 91.8 fL (ref 80.0–100.0)
Platelets: 240 10*3/uL (ref 150–400)
RBC: 5.73 MIL/uL (ref 4.22–5.81)
RDW: 14.6 % (ref 11.5–15.5)
WBC: 10.2 10*3/uL (ref 4.0–10.5)
nRBC: 0 % (ref 0.0–0.2)

## 2018-04-24 LAB — BASIC METABOLIC PANEL
Anion gap: 10 (ref 5–15)
Anion gap: 4 — ABNORMAL LOW (ref 5–15)
BUN: 23 mg/dL — ABNORMAL HIGH (ref 6–20)
BUN: 20 mg/dL (ref 6–20)
CALCIUM: 7.8 mg/dL — AB (ref 8.9–10.3)
CO2: 23 mmol/L (ref 22–32)
CO2: 33 mmol/L — ABNORMAL HIGH (ref 22–32)
Calcium: 8.3 mg/dL — ABNORMAL LOW (ref 8.9–10.3)
Chloride: 103 mmol/L (ref 98–111)
Chloride: 101 mmol/L (ref 98–111)
Creatinine, Ser: 1.26 mg/dL — ABNORMAL HIGH (ref 0.61–1.24)
Creatinine, Ser: 1.33 mg/dL — ABNORMAL HIGH (ref 0.61–1.24)
GFR calc Af Amer: >60 mL/min (ref >60)
GFR calc Af Amer: >60 mL/min (ref >60)
GFR calc non Af Amer: >60 mL/min (ref >60)
GFR calc non Af Amer: 59 mL/min — ABNORMAL LOW (ref >60)
Glucose, Bld: 89 mg/dL (ref 70–99)
Glucose, Bld: 95 mg/dL (ref 70–99)
Potassium: 5.6 mmol/L — ABNORMAL HIGH (ref 3.5–5.1)
Potassium: 3.1 mmol/L — ABNORMAL LOW (ref 3.5–5.1)
Sodium: 136 mmol/L (ref 135–145)
Sodium: 138 mmol/L (ref 135–145)

## 2018-04-24 LAB — T4, FREE: Free T4: 0.89 ng/dL (ref 0.82–1.77)

## 2018-04-24 LAB — MAGNESIUM: Magnesium: 2.3 mg/dL (ref 1.7–2.4)

## 2018-04-24 MED ORDER — SPIRONOLACTONE 25 MG PO TABS
25.0000 mg | ORAL_TABLET | Freq: Every day | ORAL | Status: DC
  Administered 2018-04-24 – 2018-04-28 (×4): 25 mg via ORAL
  Filled 2018-04-24 (×4): qty 1

## 2018-04-24 MED ORDER — ASPIRIN 81 MG PO CHEW
81.0000 mg | CHEWABLE_TABLET | ORAL | Status: AC
  Administered 2018-04-24: 81 mg via ORAL
  Filled 2018-04-24: qty 1

## 2018-04-24 MED ORDER — POTASSIUM CHLORIDE CRYS ER 20 MEQ PO TBCR
40.0000 meq | EXTENDED_RELEASE_TABLET | Freq: Once | ORAL | Status: AC
  Administered 2018-04-24: 40 meq via ORAL
  Filled 2018-04-24: qty 2

## 2018-04-24 MED ORDER — SODIUM CHLORIDE 0.9 % IV SOLN: 250.0000 mL | INTRAVENOUS | Status: DC | PRN

## 2018-04-24 MED ORDER — SPIRONOLACTONE 25 MG PO TABS: 25.0000 mg | ORAL_TABLET | Freq: Every day | ORAL | Status: DC

## 2018-04-24 MED ORDER — SODIUM CHLORIDE 0.9 % IV SOLN
INTRAVENOUS | Status: DC
  Administered 2018-04-25: 06:00:00 via INTRAVENOUS

## 2018-04-24 MED ORDER — SODIUM CHLORIDE 0.9% FLUSH
3.0000 mL | Freq: Two times a day (BID) | INTRAVENOUS | Status: DC
  Administered 2018-04-24: 3 mL via INTRAVENOUS

## 2018-04-24 MED ORDER — SODIUM CHLORIDE 0.9% FLUSH: 3.0000 mL | INTRAVENOUS | Status: DC | PRN

## 2018-04-24 NOTE — Progress Notes (Signed)
Recheck K 3.1. First lab hemolyzed. Will give him spiro 25 and K supp today and recheck BMET in am.   Alford Highland, NP

## 2018-04-24 NOTE — Plan of Care (Signed)
  Problem: Activity: Goal: Risk for activity intolerance will decrease Outcome: Progressing   Problem: Elimination: Goal: Will not experience complications related to bowel motility Outcome: Progressing   Problem: Skin Integrity: Goal: Risk for impaired skin integrity will decrease Outcome: Progressing   Problem: Activity: Goal: Capacity to carry out activities will improve Outcome: Progressing

## 2018-04-24 NOTE — H&P (View-Only) (Signed)
Advanced Heart Failure Rounding Note  PCP-Cardiologist: No primary care provider on file.   Subjective:    HF team consulted yesterday for worsening EF 20-25%. Losartan and digoxin were started. IV lasix was increased to 80 mg BID. Great diuresis with I/O -3.5 L. Weight down 6 lbs.  Creatinine 1.19 -> 1.26. K 5.6 this morning. SBP 90-100s. He had 3 runs of NSVT - 10 beats each.   Feels fine. Still orthopneic. He was SOB with significant desats when walking with PT yesterday (down to 74% on RA). Going for RHC today.   Echo 04/23/18 EF 20-25%, diffuse HK, RV mod reduced, RA and LA severely dilated, mild to mod MR  Objective:   Weight Range: 113 kg Body mass index is 33.8 kg/m.   Vital Signs:   Temp:  [97.4 F (36.3 C)-98.3 F (36.8 C)] 97.9 F (36.6 C) (03/03 0400) Pulse Rate:  [85-99] 93 (03/03 0400) Resp:  [18-19] 18 (03/03 0400) BP: (98-109)/(72-84) 103/80 (03/03 0400) SpO2:  [96 %-98 %] 98 % (03/03 0400) Weight:  [520 kg] 113 kg (03/03 0400) Last BM Date: 04/22/18  Weight change: Filed Weights   04/22/18 2143 04/23/18 0414 04/24/18 0400  Weight: 117.8 kg 115.6 kg 113 kg    Intake/Output:   Intake/Output Summary (Last 24 hours) at 04/24/2018 0733 Last data filed at 04/24/2018 0400 Gross per 24 hour  Intake 1013.33 ml  Output 3950 ml  Net -2936.67 ml      Physical Exam    General:  Well appearing. No resp difficulty HEENT: Poor dentation.  Neck: Supple. JVP 10-12. Carotids 2+ bilat; no bruits. No lymphadenopathy or thyromegaly appreciated. Cor: PMI nondisplaced. Regular rate & rhythm. No rubs, gallops or murmurs. Lungs: Clear Abdomen: Soft, nontender, nondistended. No hepatosplenomegaly. No bruits or masses. Good bowel sounds. Extremities: No cyanosis, clubbing, rash, edema. BLE TED hose. Neuro: Alert & orientedx3, cranial nerves grossly intact. moves all 4 extremities w/o difficulty. Affect pleasant   Telemetry   NSR 80-90s. 10 beats NSVT x 3.  Personally reviewed.   EKG    No new tracings.   Labs    CBC Recent Labs    04/22/18 1620 04/23/18 0334 04/24/18 0420  WBC 13.6* 11.3* 10.2  NEUTROABS 8.0*  --   --   HGB 18.7* 18.1* 17.4*  HCT 57.1* 54.2* 52.6*  MCV 92.2 91.2 91.8  PLT 288 233 240   Basic Metabolic Panel Recent Labs    80/22/33 0334 04/24/18 0420  NA 138 136  K 3.8 5.6*  CL 104 103  CO2 23 23  GLUCOSE 127* 89  BUN 22* 23*  CREATININE 1.19 1.26*  CALCIUM 8.6* 7.8*  MG 1.9 2.3   Liver Function Tests No results for input(s): AST, ALT, ALKPHOS, BILITOT, PROT, ALBUMIN in the last 72 hours. No results for input(s): LIPASE, AMYLASE in the last 72 hours. Cardiac Enzymes Recent Labs    04/22/18 2152 04/23/18 0334 04/23/18 0922  TROPONINI 0.05* 0.05* 0.04*    BNP: BNP (last 3 results) Recent Labs    04/22/18 1620  BNP 954.3*    ProBNP (last 3 results) No results for input(s): PROBNP in the last 8760 hours.   D-Dimer Recent Labs    04/22/18 1800  DDIMER 0.57*   Hemoglobin A1C Recent Labs    04/22/18 2152  HGBA1C 6.5*   Fasting Lipid Panel Recent Labs    04/22/18 2152  CHOL 112  HDL 23*  LDLCALC 71  TRIG 90  CHOLHDL  4.9   Thyroid Function Tests Recent Labs    04/22/18 2152  TSH 6.202*    Other results:   Imaging     No results found.   Medications:     Scheduled Medications: . aspirin EC  81 mg Oral Daily  . digoxin  0.125 mg Oral Daily  . enoxaparin (LOVENOX) injection  40 mg Subcutaneous Q24H  . furosemide  80 mg Intravenous BID  . losartan  12.5 mg Oral QHS  . metoprolol succinate  12.5 mg Oral BID  . rosuvastatin  40 mg Oral q1800  . sodium chloride flush  3 mL Intravenous Q12H  . sodium chloride flush  3 mL Intravenous Q12H  . spironolactone  25 mg Oral Daily     Infusions: . sodium chloride 250 mL (04/23/18 1604)  . sodium chloride    . [START ON 04/25/2018] sodium chloride       PRN Medications:  sodium chloride, sodium chloride,  acetaminophen **OR** acetaminophen, ondansetron **OR** ondansetron (ZOFRAN) IV, polyethylene glycol, sodium chloride flush, sodium chloride flush    Patient Profile   Randall Jackson is a 58 y.o. male with a history of CAD s/p RCA stent, chronic systolic HF (EF 25-30%) , ICM. MI 2011,  MR, depression, tobacco use, hyperlipidemia, and medications noncompliance.  For several years he did not take CAD meds.   Admitted with A/C systolic HF.   Assessment/Plan   1. A/C Systolic Heart Failure , ICM  ECHO 2019 Ef 20~25% . ECHO 3/2 EF 20-25%  - Volume remains elevated, but improving.  - Continue lasix 80 mg IV twice a day.  - Continue 12.5 mg toporol xl twice a day.  - Continue digoxin 0.125 mg daily. - Continue 12.5 mg losartan daily.  - Hold spiro today with hyperkalemia - Follow renal function daily.  - Going for RHC this morning. - Will need repeat echo in 3+ months, if EF remains low, would arrange for ICD (narrow QRS, not CRT candidate).  - Consult cardiac rehab and dietician.   2. CAD MI 2011 with BMS to RCA, LHC 2019 CTO RCA Troponin 0.05>0.05>0.04 - No s/s ischemia - Continue asa and statin.   3.MR  - Moderate on Dr Alford Highland review of echo 3/2.   4. Elevated TSH - Check free T3, T4 with recheck BMET this afternoon.   5. Noncompliance  - Discussed the importance of HF medications. No change  6. Snores - He will need outpatient sleep study. No change.    7. NSVT - K 5.6, mag 2.3 today.  - Continue BB.    8. Hyperkalemia - He did get 20 meq K yesterday. K 5.6 this am. Hold spiro. Give IV lasix now - Recheck this afternoon  9. Hypoxia - O2 dropped to 74% when walking with PT yesterday. - He is a former smoker, so may have COPD.  - May need O2 at home if oxygen still drops once he is fully diuresed.   Biggest concern will be ongoing medications for HF. Encouraged to follow HF Team recommendations. Refer to cardiac rehab.   Addendum: Going for RHC  tomorrow 3/4 at 8:30. Diet ordered.   Length of Stay: 2  Alford Highland, NP  04/24/2018, 7:33 AM  Advanced Heart Failure Team Pager [phone] (M-F; 7a - 4p)  Please contact [facility] for night-coverage after hours (4p -7a ) and weekends on [email or web address]  Patient seen with NP, agree with the above note.   Patient diuresed well yesterday, weight  has decreased over 10 lbs now.  Creatinine 1.33.   Several short runs NSVT.   On exam, JVP 14 cm.  Regular S1S2, no S3.  Mild crackles at bases.  1+ ankle edema.   1. Acute on chronic systolic CHF: Primarily ischemic cardiomyopathy. Recent cath in 12/19 with CTO RCA, minimal disease in the LAD and LCx.  Echo this admission showed EF 20-25% with moderate dilation and diffuse hypokinesis, moderate MR, mildly decreased RV systolic function.  Echo with diffuse hypokinesis suggests either a mixed cardiomyopathy or substantial negative remodeling post-RCA occlusion. He has never been a heavy drinker.  On exam, he remains significantly volume overloaded but breathing is getting better.  SBP 90s-100s.   - Continue Lasix 80 mg IV bid, replace K and Mg.  - Toprol XL 12.5 mg bid.  - Continue digoxin 0.125.  - Continue losartan 12.5 daily.  - Continue spironolactone 25 daily.  - After diuresis, will arrange RHC to assess filling pressures and cardiac output, discussed risks/benefits with patient and he agrees to proceed. Plan for tomorrow.   - Will need repeat echo in 3+ months, if EF remains low, would arrange for ICD (narrow QRS, not CRT candidate).  2. CAD: History of inferior MI with RCA in 2011, found to have RCA CTO on cath in 12/19 with minimal disease in LAD and LCx.   - Continue Crestor 40 mg daily.  - Continue ASA 81 daily.  3. NSVT: Keep K and Mg repleted, continue low dose Toprol XL.  4. Noncompliance: He has been reticent to take prescription meds in the past.  We discussed the importance of medication compliance today.   Marca Ancona 04/24/2018 1:10 PM

## 2018-04-24 NOTE — Progress Notes (Signed)
CARDIAC REHAB PHASE I   PRE:  Rate/Rhythm: 87 SR PVCs   BP:  Supine: 108/75  Sitting:   Standing:    SaO2: 91%RA  MODE:  Ambulation: 470 ft   POST:  Rate/Rhythm: 88  BP:  Supine:   Sitting:   Standing: 101/74   SaO2: 93-98%RA 1335-1440 Pt walked 470 ft on RA with steady gait and tolerated well. Gave CHF booklet and low sodium diets and discussed 2000 mg sodium restriction. Pt has A1C of 6.5. discussed watching carbs also. Pt seemed overwhelmed with diet changes. Discussed healthy food choices, walking for ex and CRP 2. Gave brochure [place] in case pt wants to consider CRP 2. At this time, his job does not allow for time to attend program. Discussed yellow zone and daily weights and when to call MD. Pt and wife receptive to ed.   Luetta Nutting, RN BSN  04/24/2018 2:35 PM

## 2018-04-24 NOTE — Progress Notes (Addendum)
Family Medicine Teaching Service Daily Progress Note Intern Pager: [phone]  Patient name: Randall Jackson Medical record number: [id number] Date of birth: [birth year] Age: 58 y.o. Gender: male  Primary Care Provider: Laren Boom, DO Consultants: cardiology Code Status: full  Pt Overview and Major Events to Date:  3/1 - admitted for decompensated heart failure  Assessment and Plan: Thang Craver is a 58 y.o. male presenting with increasing dyspnea on exertion, orthopnea, cough, and leg swelling concerning for worsening CHF .   Acute decompensated heart failure, with reduced ejection fraction  Vitals remained stable overnight  4.5 L urine output to 3/2, 5.5 L output from admission.  Weight is down roughly 5 kg from admission. -Continue p.o. Lasix 40 mg twice daily -Continue spironolactone -Continue Toprol-XL -Strict I's and O's -Daily weights -Add digoxin 0.125 mg daily, per heart failure team -Add losartan 12.5 mg, per heart failure team -Cardiology recommending heart catheterization this hospitalization.  Plan for 3/4  Hyperkalemia Potassium up to 5.6 this a.m.  Last potassium 3.8.  Appears to be secondary to hemolysis.  It is more likely that his potassium is down in the setting of loop diuretic use. -Redraw BMP  -Replete potassium PRN  Nonsustained ventricular tachycardia One additional episode of nonsustained V. tach noted overnight on telemetry. -Continue cardiac monitors  CAD Risk stratification labs show total cholesterol of 112, LDL 71.  No adjustment necessary on a statin regimen. -Lipitor  AKI Baseline creatinine roughly 1.0.  Admission creatinine 1.38 now downtrending to 1.19.  BUN/creatinine ratio greater than 22 1 indicating likely prerenal etiology-likely cardiogenic. -Monitor with daily's BMP  Concern for hypothyroidism TSH from a stratification labs noted to be 6.202 -Follow-up T3/T4 outpatient  FEN/GI: Heart healthy diet PPx: Lovenox, Zofran  PRN  Disposition:  Possible DC home 3/3  Subjective:  No acute events overnight.  He feels improved this morning compared to previous days.  He has no new complaints this morning.  Objective: Temp:  [97.4 F (36.3 C)-98.3 F (36.8 C)] 97.9 F (36.6 C) (03/03 0400) Pulse Rate:  [85-99] 93 (03/03 0400) Resp:  [18-19] 18 (03/03 0400) BP: (98-109)/(72-84) 103/80 (03/03 0400) SpO2:  [96 %-98 %] 98 % (03/03 0400) Weight:  [235 kg] 113 kg (03/03 0400)  Physical Exam: General: Alert and cooperative and appears to be in no acute distress HEENT: Moist mucous membranes, JVD decreased from 3/2. Cardio: Normal S1 and S2, no S3 or S4. Rhythm is regular. No murmurs or rubs.   Pulm: Clear to auscultation bilaterally, no crackles, wheezing, or diminished breath sounds. Normal respiratory effort Abdomen: Bowel sounds normal. Abdomen soft and non-tender.  Extremities: 2+ LE edema. Warm/ well perfused.  Strong radial pulse. Neuro: Cranial nerves grossly intact  Laboratory: Recent Labs  Lab 04/22/18 1620 04/23/18 0334 04/24/18 0420  WBC 13.6* 11.3* 10.2  HGB 18.7* 18.1* 17.4*  HCT 57.1* 54.2* 52.6*  PLT 288 233 240   Recent Labs  Lab 04/22/18 1620 04/22/18 1800 04/23/18 0334 04/24/18 0420  NA 133*  --  138 136  K 6.5* 4.5 3.8 5.6*  CL 104  --  104 103  CO2 18*  --  23 23  BUN 28*  --  22* 23*  CREATININE 1.38*  --  1.19 1.26*  CALCIUM 8.4*  --  8.6* 7.8*  GLUCOSE 140*  --  127* 89      Imaging/Diagnostic Tests: Dg Chest Port 1 View  Result Date: 04/22/2018 CLINICAL DATA:  Acute shortness of breath and chest pain. EXAM:  PORTABLE CHEST 1 VIEW COMPARISON:  None. FINDINGS: Cardiomegaly and pulmonary vascular congestion noted. There is no evidence of focal airspace disease, pulmonary edema, suspicious pulmonary nodule/mass, pleural effusion, or pneumothorax. No acute bony abnormalities are identified. IMPRESSION: Cardiomegaly with pulmonary vascular congestion. Electronically Signed    By: Harmon Pier M.D.   On: 04/22/2018 17:20     Mirian Mo, MD 04/24/2018, 6:01 AM PGY-1, [facility] Family Medicine FPTS Intern pager: [phone], text pages welcome

## 2018-04-24 NOTE — Evaluation (Signed)
Occupational Therapy Evaluation Patient Details Name: Randall Jackson MRN: [id number] DOB: 12-25-1960 Today's Date: 04/24/2018    History of Present Illness 58 y.o. male CAD with NSTEMI/BMS x2 to RCA in 2011, post-cath pericardial hermatoma, prior noncompliance, ICM/chronic combined CHF, mitral regurgitation, moderate-severe pulmary HTN, HTN, elevated LFTs, HLD who presented to [facility] with worsening CHF.   Clinical Impression   Pt is at independent baseline level of function with ADLs and mobility. All education completed and no further acute OT is indicated at this time    Follow Up Recommendations  No OT follow up    Equipment Recommendations  None recommended by OT    Recommendations for Other Services       Precautions / Restrictions Precautions Precautions: Fall Restrictions Weight Bearing Restrictions: No      Mobility Bed Mobility Overal bed mobility: Independent                Transfers Overall transfer level: Independent Equipment used: None                  Balance Overall balance assessment: Independent                                         ADL either performed or assessed with clinical judgement   ADL Overall ADL's : Modified independent;Independent;At baseline                                             Vision Patient Visual Report: No change from baseline       Perception     Praxis      Pertinent Vitals/Pain Pain Assessment: No/denies pain     Hand Dominance Right   Extremity/Trunk Assessment Upper Extremity Assessment Upper Extremity Assessment: Overall WFL for tasks assessed   Lower Extremity Assessment Lower Extremity Assessment: Defer to PT evaluation   Cervical / Trunk Assessment Cervical / Trunk Assessment: Normal   Communication Communication Communication: No difficulties   Cognition Arousal/Alertness: Awake/alert Behavior During Therapy: WFL for tasks  assessed/performed Overall Cognitive Status: Within Functional Limits for tasks assessed                                 General Comments: very talkative   General Comments       Exercises     Shoulder Instructions      Home Living Family/patient expects to be discharged to:: Private residence Living Arrangements: Spouse/significant other Available Help at Discharge: Family;Available PRN/intermittently Type of Home: House Home Access: Stairs to enter [employer] of Steps: 6 Entrance Stairs-Rails: Right;Left Home Layout: One level     Bathroom Shower/Tub: Chief Strategy Officer: Handicapped height     Home Equipment: Cane - single point          Prior Functioning/Environment Level of Independence: Independent        Comments: Driving and working PTA        OT Problem List: Decreased activity tolerance      OT Treatment/Interventions: Equities trader education    OT Goals(Current goals can be found in the care plan section) Acute Rehab OT Goals Patient Stated Goal: to quit being SOB OT Goal Formulation: With patient  OT  Frequency:     Barriers to D/C:    no barriers       Co-evaluation              AM-PAC OT "6 Clicks" Daily Activity     Outcome Measure Help from another person eating meals?: None Help from another person taking care of personal grooming?: None Help from another person toileting, which includes using toliet, bedpan, or urinal?: None Help from another person bathing (including washing, rinsing, drying)?: None Help from another person to put on and taking off regular upper body clothing?: None Help from another person to put on and taking off regular lower body clothing?: None 6 Click Score: 24   End of Session    Activity Tolerance: Patient tolerated treatment well Patient left: Other (comment)(in bathroom)  OT Visit Diagnosis: Muscle weakness (generalized) (M62.81)                Time:  1505-6979 OT Time Calculation (min): 18 min Charges:  OT General Charges $OT Visit: 1 Visit OT Evaluation $OT Eval Moderate Complexity: 1 Mod    Galen Manila 04/24/2018, 10:01 AM

## 2018-04-24 NOTE — Progress Notes (Addendum)
Advanced Heart Failure Rounding Note  PCP-Cardiologist: No primary care provider on file.   Subjective:    HF team consulted yesterday for worsening EF 20-25%. Losartan and digoxin were started. IV lasix was increased to 80 mg BID. Great diuresis with I/O -3.5 L. Weight down 6 lbs.  Creatinine 1.19 -> 1.26. K 5.6 this morning. SBP 90-100s. Randall Jackson had 3 runs of NSVT - 10 beats each.   Feels fine. Still orthopneic. Randall Jackson was SOB with significant desats when walking with PT yesterday (down to 74% on RA). Going for RHC today.   Echo 04/23/18 EF 20-25%, diffuse HK, RV mod reduced, RA and LA severely dilated, mild to mod MR  Objective:   Weight Range: 113 kg Body mass index is 33.8 kg/m.   Vital Signs:   Temp:  [97.4 F (36.3 C)-98.3 F (36.8 C)] 97.9 F (36.6 C) (03/03 0400) Pulse Rate:  [85-99] 93 (03/03 0400) Resp:  [18-19] 18 (03/03 0400) BP: (98-109)/(72-84) 103/80 (03/03 0400) SpO2:  [96 %-98 %] 98 % (03/03 0400) Weight:  [520 kg] 113 kg (03/03 0400) Last BM Date: 04/22/18  Weight change: Filed Weights   04/22/18 2143 04/23/18 0414 04/24/18 0400  Weight: 117.8 kg 115.6 kg 113 kg    Intake/Output:   Intake/Output Summary (Last 24 hours) at 04/24/2018 0733 Last data filed at 04/24/2018 0400 Gross per 24 hour  Intake 1013.33 ml  Output 3950 ml  Net -2936.67 ml      Physical Exam    General:  Well appearing. No resp difficulty HEENT: Poor dentation.  Neck: Supple. JVP 10-12. Carotids 2+ bilat; no bruits. No lymphadenopathy or thyromegaly appreciated. Cor: PMI nondisplaced. Regular rate & rhythm. No rubs, gallops or murmurs. Lungs: Clear Abdomen: Soft, nontender, nondistended. No hepatosplenomegaly. No bruits or masses. Good bowel sounds. Extremities: No cyanosis, clubbing, rash, edema. BLE TED hose. Neuro: Alert & orientedx3, cranial nerves grossly intact. moves all 4 extremities w/o difficulty. Affect pleasant   Telemetry   NSR 80-90s. 10 beats NSVT x 3.  Personally reviewed.   EKG    No new tracings.   Labs    CBC Recent Labs    04/22/18 1620 04/23/18 0334 04/24/18 0420  WBC 13.6* 11.3* 10.2  NEUTROABS 8.0*  --   --   HGB 18.7* 18.1* 17.4*  HCT 57.1* 54.2* 52.6*  MCV 92.2 91.2 91.8  PLT 288 233 240   Basic Metabolic Panel Recent Labs    80/22/33 0334 04/24/18 0420  NA 138 136  K 3.8 5.6*  CL 104 103  CO2 23 23  GLUCOSE 127* 89  BUN 22* 23*  CREATININE 1.19 1.26*  CALCIUM 8.6* 7.8*  MG 1.9 2.3   Liver Function Tests No results for input(s): AST, ALT, ALKPHOS, BILITOT, PROT, ALBUMIN in the last 72 hours. No results for input(s): LIPASE, AMYLASE in the last 72 hours. Cardiac Enzymes Recent Labs    04/22/18 2152 04/23/18 0334 04/23/18 0922  TROPONINI 0.05* 0.05* 0.04*    BNP: BNP (last 3 results) Recent Labs    04/22/18 1620  BNP 954.3*    ProBNP (last 3 results) No results for input(s): PROBNP in the last 8760 hours.   D-Dimer Recent Labs    04/22/18 1800  DDIMER 0.57*   Hemoglobin A1C Recent Labs    04/22/18 2152  HGBA1C 6.5*   Fasting Lipid Panel Recent Labs    04/22/18 2152  CHOL 112  HDL 23*  LDLCALC 71  TRIG 90  CHOLHDL  4.9   Thyroid Function Tests Recent Labs    04/22/18 2152  TSH 6.202*    Other results:   Imaging     No results found.   Medications:     Scheduled Medications: . aspirin EC  81 mg Oral Daily  . digoxin  0.125 mg Oral Daily  . enoxaparin (LOVENOX) injection  40 mg Subcutaneous Q24H  . furosemide  80 mg Intravenous BID  . losartan  12.5 mg Oral QHS  . metoprolol succinate  12.5 mg Oral BID  . rosuvastatin  40 mg Oral q1800  . sodium chloride flush  3 mL Intravenous Q12H  . sodium chloride flush  3 mL Intravenous Q12H  . spironolactone  25 mg Oral Daily     Infusions: . sodium chloride 250 mL (04/23/18 1604)  . sodium chloride    . [START ON 04/25/2018] sodium chloride       PRN Medications:  sodium chloride, sodium chloride,  acetaminophen **OR** acetaminophen, ondansetron **OR** ondansetron (ZOFRAN) IV, polyethylene glycol, sodium chloride flush, sodium chloride flush    Patient Profile   Randall Jackson is a 58 y.o. male with a history of CAD s/p RCA stent, chronic systolic HF (EF 25-30%) , ICM. MI 2011,  MR, depression, tobacco use, hyperlipidemia, and medications noncompliance.  For several years Randall Jackson did not take CAD meds.   Admitted with A/C systolic HF.   Assessment/Plan   1. A/C Systolic Heart Failure , ICM  ECHO 2019 Ef 20~25% . ECHO 3/2 EF 20-25%  - Volume remains elevated, but improving.  - Continue lasix 80 mg IV twice a day.  - Continue 12.5 mg toporol xl twice a day.  - Continue digoxin 0.125 mg daily. - Continue 12.5 mg losartan daily.  - Hold spiro today with hyperkalemia - Follow renal function daily.  - Going for RHC this morning. - Will need repeat echo in 3+ months, if EF remains low, would arrange for ICD (narrow QRS, not CRT candidate).  - Consult cardiac rehab and dietician.   2. CAD MI 2011 with BMS to RCA, LHC 2019 CTO RCA Troponin 0.05>0.05>0.04 - No s/s ischemia - Continue asa and statin.   3.MR  - Moderate on Dr Alford Highland review of echo 3/2.   4. Elevated TSH - Check free T3, T4 with recheck BMET this afternoon.   5. Noncompliance  - Discussed the importance of HF medications. No change  6. Snores - Randall Jackson will need outpatient sleep study. No change.    7. NSVT - K 5.6, mag 2.3 today.  - Continue BB.    8. Hyperkalemia - Randall Jackson did get 20 meq K yesterday. K 5.6 this am. Hold spiro. Give IV lasix now - Recheck this afternoon  9. Hypoxia - O2 dropped to 74% when walking with PT yesterday. - Randall Jackson is a former smoker, so may have COPD.  - May need O2 at home if oxygen still drops once Randall Jackson is fully diuresed.   Biggest concern will be ongoing medications for HF. Encouraged to follow HF Team recommendations. Refer to cardiac rehab.   Addendum: Going for RHC  tomorrow 3/4 at 8:30. Diet ordered.   Length of Stay: 2  Alford Highland, NP  04/24/2018, 7:33 AM  Advanced Heart Failure Team Pager [phone] (M-F; 7a - 4p)  Please contact [facility] for night-coverage after hours (4p -7a ) and weekends on [email or web address]  Patient seen with NP, agree with the above note.   Patient diuresed well yesterday, weight  has decreased over 10 lbs now.  Creatinine 1.33.   Several short runs NSVT.   On exam, JVP 14 cm.  Regular S1S2, no S3.  Mild crackles at bases.  1+ ankle edema.   1. Acute on chronic systolic CHF: Primarily ischemic cardiomyopathy. Recent cath in 12/19 with CTO RCA, minimal disease in the LAD and LCx.  Echo this admission showed EF 20-25% with moderate dilation and diffuse hypokinesis, moderate MR, mildly decreased RV systolic function.  Echo with diffuse hypokinesis suggests either a mixed cardiomyopathy or substantial negative remodeling post-RCA occlusion. Randall Jackson has never been a heavy drinker.  On exam, Randall Jackson remains significantly volume overloaded but breathing is getting better.  SBP 90s-100s.   - Continue Lasix 80 mg IV bid, replace K and Mg.  - Toprol XL 12.5 mg bid.  - Continue digoxin 0.125.  - Continue losartan 12.5 daily.  - Continue spironolactone 25 daily.  - After diuresis, will arrange RHC to assess filling pressures and cardiac output, discussed risks/benefits with patient and Randall Jackson agrees to proceed. Plan for tomorrow.   - Will need repeat echo in 3+ months, if EF remains low, would arrange for ICD (narrow QRS, not CRT candidate).  2. CAD: History of inferior MI with RCA in 2011, found to have RCA CTO on cath in 12/19 with minimal disease in LAD and LCx.   - Continue Crestor 40 mg daily.  - Continue ASA 81 daily.  3. NSVT: Keep K and Mg repleted, continue low dose Toprol XL.  4. Noncompliance: Randall Jackson has been reticent to take prescription meds in the past.  We discussed the importance of medication compliance today.   Mahkayla Preece 04/24/2018 1:10 PM  

## 2018-04-24 NOTE — Progress Notes (Signed)
Nutrition Education Note  RD consulted for nutrition education regarding CHF.  Case discussed with RN, who reports that pt with a good appetite, eating 75-100% of meals. RN confirms that pt has been non-compliant with medications and diet in the past and would benefit from reinforcement.   Pt sleeping soundly and snoring audibly at time of visit. He did not arouse to voice and wife was not present. RD provided "Low Sodium Nutrition Therapy" handout from the Academy of [employer] and Dietetics with RD contact information.   Per RN, plan for cath tomorrow. RD will attempt to return for additional reinforcement.   Randall Jackson A. Mayford Knife, RD, LDN, CDCES Registered Dietitian II Certified Diabetes Care and Education Specialist Pager: [phone] After hours Pager: [phone]

## 2018-04-24 NOTE — Progress Notes (Signed)
Physical Therapy Treatment Patient Details Name: Randall Jackson MRN: [id number] DOB: 11-05-1960 Today's Date: 04/24/2018    History of Present Illness 58 y.o. male CAD with NSTEMI/BMS x2 to RCA in 2011, post-cath pericardial hermatoma, prior noncompliance, ICM/chronic combined CHF, mitral regurgitation, moderate-severe pulmary HTN, HTN, elevated LFTs, HLD who presented to [facility] with worsening CHF.    PT Comments    Pt doing much better with mobility today, ambulated 500' and ascended 10 steps with SpO2 no lower than 95% on RA, HR up to 96 bpm. Pt able to converse while ambulating, DOE noted only on stairs. Will follow after heart cath but will not likely need PT after that point.     Follow Up Recommendations  No PT follow up;Supervision - Intermittent     Equipment Recommendations  None recommended by PT    Recommendations for Other Services       Precautions / Restrictions Precautions Precautions: Fall Restrictions Weight Bearing Restrictions: No    Mobility  Bed Mobility Overal bed mobility: Independent                Transfers Overall transfer level: Independent Equipment used: None                Ambulation/Gait Ambulation/Gait assistance: Supervision Gait Distance (Feet): 500 Feet Assistive device: None Gait Pattern/deviations: Step-through pattern;Drifts right/left Gait velocity: decreased Gait velocity interpretation: >2.62 ft/sec, indicative of community ambulatory General Gait Details: occasional misstep, mostly due to talking and becoming distracted. Self corrected. SpO2 no lower than 95% on RA   Stairs Stairs: Yes Stairs assistance: Supervision Stair Management: One rail Left;Alternating pattern;Forwards Number of Stairs: 10 General stair comments: pt with 2/4 DOE after stairs but able to continue conversing. 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 ALso discussed appropriate exercises options including cardio and strength training with low wt high reps (he has been into high wt lifting)      Pertinent Vitals/Pain Pain Assessment: No/denies pain    Home Living Family/patient expects to be discharged to:: Private residence Living Arrangements: Spouse/significant other Available Help at Discharge: Family;Available PRN/intermittently Type of Home: House Home Access: Stairs to enter Entrance Stairs-Rails: Right;Left Home Layout: One level Home Equipment: Cane - single point      Prior Function Level of Independence: Independent      Comments: Driving and working PTA   PT Goals (current goals can now be found in the care plan section) Acute Rehab PT Goals Patient Stated Goal: to quit being SOB PT Goal Formulation: With patient Time For Goal Achievement:  05/07/18 Potential to Achieve Goals: Good Progress towards PT goals: Progressing toward goals    Frequency    Min 3X/week      PT Plan Current plan remains appropriate    Co-evaluation              AM-PAC PT "6 Clicks" Mobility   Outcome Measure  Help needed turning from your back to your side while in a flat bed without using bedrails?: None Help needed moving from lying on your back to sitting on the side of a flat bed without using bedrails?: None Help needed moving to and from a bed to a chair (including a  wheelchair)?: None Help needed standing up from a chair using your arms (e.g., wheelchair or bedside chair)?: None Help needed to walk in hospital room?: None Help needed climbing 3-5 steps with a railing? : A Little 6 Click Score: 23    End of Session   Activity Tolerance: Patient tolerated treatment well Patient left: in bed;with call bell/phone within reach Nurse Communication: Mobility status PT Visit Diagnosis: Muscle weakness (generalized) (M62.81)     Time: 9381-8299 PT Time Calculation (min) (ACUTE ONLY): 21 min  Charges:  $Gait Training: 8-22 mins                     Lyanne Co, PT  Acute Rehab Services  Pager [phone] Office [phone]    Lawana Chambers Ruqaya Strauss 04/24/2018, 11:05 AM

## 2018-04-24 NOTE — Progress Notes (Addendum)
Per CCMD; pt had 9 beats of VTach at 1237. Pt asymptomatic and talking to  family at bedside.Strip printed and placed on pts chart.  Advanced Heart Failure Team paged; Katrinka Blazing NP.

## 2018-04-24 NOTE — Progress Notes (Signed)
I responded to a [place] to provide spiritual support for the patient. I visited the patient's room with his wife present. The patient was unavailable at the time. I spoke briefly with his wife and shared that the Chaplain is available to provide support as needed or requested.    04/24/18 1000  Clinical Encounter Type  Visited With Family  Visit Type Spiritual support  Referral From Nurse  Consult/Referral To Chaplain  Spiritual Encounters  Spiritual Needs Prayer    Chaplain Dr Melvyn Novas

## 2018-04-25 ENCOUNTER — Encounter (HOSPITAL_COMMUNITY): Payer: Self-pay | Admitting: Cardiology

## 2018-04-25 ENCOUNTER — Other Ambulatory Visit: Payer: Self-pay

## 2018-04-25 ENCOUNTER — Inpatient Hospital Stay: Payer: Self-pay

## 2018-04-25 ENCOUNTER — Encounter (HOSPITAL_COMMUNITY)
Admission: EM | Disposition: A | Discharge status: Home / Self Care | Payer: Self-pay | Source: Home / Self Care | Attending: Family Medicine

## 2018-04-25 DIAGNOSIS — I509 Heart failure, unspecified: Secondary | ICD-10-CM

## 2018-04-25 HISTORY — PX: RIGHT HEART CATH: CATH118263

## 2018-04-25 LAB — BASIC METABOLIC PANEL
ANION GAP: 11 (ref 5–15)
BUN: 20 mg/dL (ref 6–20)
CO2: 27 mmol/L (ref 22–32)
Calcium: 8.2 mg/dL — ABNORMAL LOW (ref 8.9–10.3)
Chloride: 101 mmol/L (ref 98–111)
Creatinine, Ser: 1.43 mg/dL — ABNORMAL HIGH (ref 0.61–1.24)
GFR, EST NON AFRICAN AMERICAN: 54 mL/min — AB (ref >60)
Glucose, Bld: 97 mg/dL (ref 70–99)
Potassium: 3.6 mmol/L (ref 3.5–5.1)
Sodium: 139 mmol/L (ref 135–145)

## 2018-04-25 LAB — POCT I-STAT EG7
Acid-Base Excess: 4 mmol/L — ABNORMAL HIGH (ref 0.0–2.0)
Acid-Base Excess: 3 mmol/L — ABNORMAL HIGH (ref 0.0–2.0)
Bicarbonate: 29.7 mmol/L — ABNORMAL HIGH (ref 20.0–28.0)
Bicarbonate: 28.6 mmol/L — ABNORMAL HIGH (ref 20.0–28.0)
Calcium, Ion: 1.1 mmol/L — ABNORMAL LOW (ref 1.15–1.40)
Calcium, Ion: 1.07 mmol/L — ABNORMAL LOW (ref 1.15–1.40)
HCT: 56 % — ABNORMAL HIGH (ref 39.0–52.0)
HCT: 55 % — ABNORMAL HIGH (ref 39.0–52.0)
HEMOGLOBIN: 18.7 g/dL — AB (ref 13.0–17.0)
Hemoglobin: 19 g/dL — ABNORMAL HIGH (ref 13.0–17.0)
O2 Saturation: 63 %
O2 Saturation: 58 %
PH VEN: 7.411 (ref 7.250–7.430)
Potassium: 3.8 mmol/L (ref 3.5–5.1)
Potassium: 3.7 mmol/L (ref 3.5–5.1)
Sodium: 140 mmol/L (ref 135–145)
Sodium: 141 mmol/L (ref 135–145)
TCO2: 31 mmol/L (ref 22–32)
TCO2: 30 mmol/L (ref 22–32)
pCO2, Ven: 46.7 mmHg (ref 44.0–60.0)
pCO2, Ven: 45.9 mmHg (ref 44.0–60.0)
pH, Ven: 7.403 (ref 7.250–7.430)
pO2, Ven: 33 mmHg (ref 32.0–45.0)
pO2, Ven: 31 mmHg — CL (ref 32.0–45.0)

## 2018-04-25 LAB — CREATININE, SERUM
Creatinine, Ser: 1.36 mg/dL — ABNORMAL HIGH (ref 0.61–1.24)
GFR calc Af Amer: >60 mL/min (ref >60)
GFR calc non Af Amer: 58 mL/min — ABNORMAL LOW (ref >60)

## 2018-04-25 LAB — CBC
HCT: 56.3 % — ABNORMAL HIGH (ref 39.0–52.0)
Hemoglobin: 18.4 g/dL — ABNORMAL HIGH (ref 13.0–17.0)
MCH: 30.3 pg (ref 26.0–34.0)
MCHC: 32.7 g/dL (ref 30.0–36.0)
MCV: 92.6 fL (ref 80.0–100.0)
Platelets: 245 10*3/uL (ref 150–400)
RBC: 6.08 MIL/uL — ABNORMAL HIGH (ref 4.22–5.81)
RDW: 14.5 % (ref 11.5–15.5)
WBC: 9.6 10*3/uL (ref 4.0–10.5)
nRBC: 0 % (ref 0.0–0.2)

## 2018-04-25 LAB — T3, FREE: T3, Free: 2.6 pg/mL (ref 2.0–4.4)

## 2018-04-25 LAB — MAGNESIUM: Magnesium: 2.1 mg/dL (ref 1.7–2.4)

## 2018-04-25 SURGERY — RIGHT HEART CATH
Anesthesia: LOCAL

## 2018-04-25 MED ORDER — HEPARIN SODIUM (PORCINE) 5000 UNIT/ML IJ SOLN: 5000.0000 [IU] | Freq: Three times a day (TID) | INTRAMUSCULAR | Status: DC

## 2018-04-25 MED ORDER — LIDOCAINE HCL (PF) 1 % IJ SOLN
INTRAMUSCULAR | Status: AC
  Filled 2018-04-25: qty 30

## 2018-04-25 MED ORDER — FUROSEMIDE 10 MG/ML IJ SOLN
INTRAMUSCULAR | Status: AC
  Filled 2018-04-25: qty 8

## 2018-04-25 MED ORDER — SODIUM CHLORIDE 0.9% FLUSH: 3.0000 mL | INTRAVENOUS | Status: DC | PRN

## 2018-04-25 MED ORDER — AMIODARONE LOAD VIA INFUSION
150.0000 mg | Freq: Once | INTRAVENOUS | Status: AC
  Administered 2018-04-25: 150 mg via INTRAVENOUS
  Filled 2018-04-25: qty 83.34

## 2018-04-25 MED ORDER — FUROSEMIDE 10 MG/ML IJ SOLN
80.0000 mg | Freq: Two times a day (BID) | INTRAMUSCULAR | Status: DC
  Administered 2018-04-25 (×2): 80 mg via INTRAVENOUS
  Filled 2018-04-25: qty 8

## 2018-04-25 MED ORDER — SODIUM CHLORIDE 0.9% FLUSH: 10.0000 mL | INTRAVENOUS | Status: DC | PRN

## 2018-04-25 MED ORDER — AMIODARONE HCL IN DEXTROSE 360-4.14 MG/200ML-% IV SOLN
30.0000 mg/h | INTRAVENOUS | Status: DC
  Administered 2018-04-25 – 2018-04-26 (×4): 30 mg/h via INTRAVENOUS
  Filled 2018-04-25 (×3): qty 200

## 2018-04-25 MED ORDER — SODIUM CHLORIDE 0.9 % IV SOLN: 250.0000 mL | INTRAVENOUS | Status: DC | PRN

## 2018-04-25 MED ORDER — SODIUM CHLORIDE 0.9% FLUSH: 3.0000 mL | Freq: Two times a day (BID) | INTRAVENOUS | Status: DC

## 2018-04-25 MED ORDER — SODIUM CHLORIDE 0.9% FLUSH
10.0000 mL | Freq: Two times a day (BID) | INTRAVENOUS | Status: DC
  Administered 2018-04-25: 10 mL
  Administered 2018-04-26: 30 mL
  Administered 2018-04-27: 20 mL
  Administered 2018-04-27: 10 mL

## 2018-04-25 MED ORDER — ACETAMINOPHEN 325 MG PO TABS: 650.0000 mg | ORAL_TABLET | ORAL | Status: DC | PRN

## 2018-04-25 MED ORDER — APIXABAN 5 MG PO TABS
5.0000 mg | ORAL_TABLET | Freq: Two times a day (BID) | ORAL | Status: DC
  Administered 2018-04-25 – 2018-04-26 (×2): 5 mg via ORAL
  Filled 2018-04-25 (×4): qty 1

## 2018-04-25 MED ORDER — HEPARIN (PORCINE) IN NACL 1000-0.9 UT/500ML-% IV SOLN
INTRAVENOUS | Status: DC | PRN
  Administered 2018-04-25: 500 mL

## 2018-04-25 MED ORDER — HEPARIN (PORCINE) IN NACL 1000-0.9 UT/500ML-% IV SOLN
INTRAVENOUS | Status: AC
  Filled 2018-04-25: qty 500

## 2018-04-25 MED ORDER — POTASSIUM CHLORIDE CRYS ER 20 MEQ PO TBCR
40.0000 meq | EXTENDED_RELEASE_TABLET | Freq: Two times a day (BID) | ORAL | Status: DC
  Administered 2018-04-25 – 2018-04-28 (×6): 40 meq via ORAL
  Filled 2018-04-25 (×7): qty 2

## 2018-04-25 MED ORDER — AMIODARONE HCL IN DEXTROSE 360-4.14 MG/200ML-% IV SOLN
60.0000 mg/h | INTRAVENOUS | Status: AC
  Administered 2018-04-25: 60 mg/h via INTRAVENOUS
  Filled 2018-04-25 (×2): qty 200

## 2018-04-25 MED ORDER — AMIODARONE IV BOLUS ONLY 150 MG/100ML
150.0000 mg | Freq: Once | INTRAVENOUS | Status: AC
  Administered 2018-04-25: 150 mg via INTRAVENOUS

## 2018-04-25 MED ORDER — LIDOCAINE HCL (PF) 1 % IJ SOLN
INTRAMUSCULAR | Status: DC | PRN
  Administered 2018-04-25: 2 mL

## 2018-04-25 MED ORDER — ONDANSETRON HCL 4 MG/2ML IJ SOLN: 4.0000 mg | Freq: Four times a day (QID) | INTRAMUSCULAR | Status: DC | PRN

## 2018-04-25 MED ORDER — LOSARTAN POTASSIUM 25 MG PO TABS
12.5000 mg | ORAL_TABLET | Freq: Two times a day (BID) | ORAL | Status: DC
  Administered 2018-04-25: 12.5 mg via ORAL
  Filled 2018-04-25: qty 0.5
  Filled 2018-04-25: qty 1

## 2018-04-25 SURGICAL SUPPLY — 8 items
CATH BALLN WEDGE 5F 110CM (CATHETERS) ×2 IMPLANT
PACK CARDIAC CATHETERIZATION (CUSTOM PROCEDURE TRAY) ×2 IMPLANT
PROTECTION STATION PRESSURIZED (MISCELLANEOUS) ×2
SHEATH GLIDE SLENDER 4/5FR (SHEATH) ×2 IMPLANT
STATION PROTECTION PRESSURIZED (MISCELLANEOUS) ×1 IMPLANT
TRANSDUCER W/STOPCOCK (MISCELLANEOUS) ×2 IMPLANT
TUBING ART PRESS 72  MALE/FEM (TUBING) ×1
TUBING ART PRESS 72 MALE/FEM (TUBING) ×1 IMPLANT

## 2018-04-25 NOTE — Progress Notes (Signed)
Report received from cath lab  that patient will transfer to progressive care for CVP monitoring.

## 2018-04-25 NOTE — Progress Notes (Signed)
   04/25/18 0128  Vitals  Temp 98.2 F (36.8 C)  Temp Source Oral  BP 95/81  MAP (mmHg) 88  BP Method Automatic  Pulse Rate 91  Pulse Rate Source Monitor  Resp 20  Oxygen Therapy  SpO2 96 %  MEWS Score  MEWS RR 0  MEWS Pulse 0  MEWS Systolic 1  MEWS LOC 0  MEWS Temp 0  MEWS Score 1  MEWS Score Color Green  Provider Notification  Provider Sandra Cockayne, MD  Date Provider Notified 04/25/18  Time Provider Notified 0134  Notification Type Page  Notification Reason Change in status (vtach)   Pt with 29 beats of vtach.  Pt states he was symptomatic and it woke him up from sleep gasping for air.  No further symptoms.  VSS.

## 2018-04-25 NOTE — Progress Notes (Signed)
Family Medicine Teaching Service Daily Progress Note Intern Pager: [phone]  Patient name: Randall Jackson Medical record number: [id number] Date of birth: 19-Apr-1960 Age: 58 y.o. Gender: male  Primary Care Provider: Laren Boom, DO Consultants: cardiology Code Status: full  Pt Overview and Major Events to Date:  3/1 - admitted for decompensated heart failure  Assessment and Plan: Randall Jackson is a 58 y.o. male presenting with increasing dyspnea on exertion, orthopnea, cough, and leg swelling concerning for worsening CHF .   Acute decompensated heart failure, with reduced ejection fraction  Vitals notable for mild tachycardia which resolved overnight.  5.3 L diuresed on 3/3.  Total weight loss of 7.2 kg from admission. -IV lasix 80 mg BID, consider transition to p.o. following cath -spironolactone 25 mg -Toprol-XL -digoxin 0.125 mg daily -losartan 12.5 mg -Cardiology recommending heart catheterization this hospitalization.  Plan for 3/4  Hyperkalemia-stable Potassium 3.6 this a.m. -Monitor BMP  -Replete potassium PRN  Nonsustained ventricular tachycardia One run of 6 PVCs seen on telemetry on 3/3. -Continue cardiac monitors  CAD Risk stratification labs show total cholesterol of 112, LDL 71.  No adjustment necessary on a statin regimen. -Lipitor  AKI Mild increase in creatinine overnight from 1.33-1.43.  BUN/creatinine ratio greater than 20 indicates likely prerenal etiology.  Likely secondary to ongoing diuresis. -Monitor with daily's BMP -Avoid nephrotoxic agents  Concern for hypothyroidism TSH from a stratification labs noted to be 6.202 -Follow-up T3/T4 outpatient  FEN/GI: Heart healthy diet PPx: Lovenox, Zofran PRN  Disposition: Possible DC 3/5  Subjective:  Seen briefly while being wheeled out of the room this morning and route to his cardiac catheterization.  Is in good spirits this morning.  Objective: Temp:  [97.2 F (36.2 C)-98.2 F (36.8 C)]  98.2 F (36.8 C) (03/04 0353) Pulse Rate:  [80-95] 94 (03/04 0353) Resp:  [18-20] 18 (03/04 0353) BP: (95-109)/(66-87) 95/66 (03/04 0353) SpO2:  [92 %-98 %] 98 % (03/04 0353) Weight:  [110.7 kg] 110.7 kg (03/04 0353)  Physical Exam: General: Alert and cooperative and appears to be in no acute distress.  Resting in bed comfortably.  In good spirits today. HEENT:  moist mucous membranes Pulm: Normal respiratory effort on room air.    Extremities: Warm, well-perfused. Neuro: Cranial nerves grossly intact  Laboratory: Recent Labs  Lab 04/23/18 0334 04/24/18 0420 04/25/18 0344  WBC 11.3* 10.2 9.6  HGB 18.1* 17.4* 18.4*  HCT 54.2* 52.6* 56.3*  PLT 233 240 245   Recent Labs  Lab 04/24/18 0420 04/24/18 1047 04/25/18 0344  NA 136 138 139  K 5.6* 3.1* 3.6  CL 103 101 101  CO2 23 33* 27  BUN 23* 20 20  CREATININE 1.26* 1.33* 1.43*  CALCIUM 7.8* 8.3* 8.2*  GLUCOSE 89 95 97      Imaging/Diagnostic Tests: Dg Chest Port 1 View  Result Date: 04/22/2018 CLINICAL DATA:  Acute shortness of breath and chest pain. EXAM: PORTABLE CHEST 1 VIEW COMPARISON:  None. FINDINGS: Cardiomegaly and pulmonary vascular congestion noted. There is no evidence of focal airspace disease, pulmonary edema, suspicious pulmonary nodule/mass, pleural effusion, or pneumothorax. No acute bony abnormalities are identified. IMPRESSION: Cardiomegaly with pulmonary vascular congestion. Electronically Signed   By: Harmon Pier M.D.   On: 04/22/2018 17:20     Mirian Mo, MD 04/25/2018, 6:19 AM PGY-1, [facility] Family Medicine FPTS Intern pager: [phone], text pages welcome

## 2018-04-25 NOTE — Progress Notes (Signed)
HR elevated to 140s while up. Rate now 110-120s, looks irregular. EKG reviewed and shows atrial flutter (V2 looks like sinus tach, but confirmed with MD he is in flutter). Will start amio drip and Eliquis.   Alford Highland, NP

## 2018-04-25 NOTE — Interval H&P Note (Signed)
History and Physical Interval Note:  04/25/2018 9:02 AM  Randall Jackson  has presented today for surgery, with the diagnosis of hf  The various methods of treatment have been discussed with the patient and family. After consideration of risks, benefits and other options for treatment, the patient has consented to  Procedure(s): RIGHT HEART CATH (N/A) as a surgical intervention .  The patient's history has been reviewed, patient examined, no change in status, stable for surgery.  I have reviewed the patient's chart and labs.  Questions were answered to the patient's satisfaction.     Brittany Osier [employer]

## 2018-04-25 NOTE — Progress Notes (Addendum)
Pt experienced HR 140"s post getting up to void. 2LNC started @ lead ECG done. Pt was seen by NP. Will give digoxin-held for procedure.

## 2018-04-25 NOTE — Progress Notes (Signed)
Nutrition Education Follow-Up Note  Attempted to see pt again for diet education, however, pt remains in the cath lab. Spoke with Licensed conveyancer, who reports pt will not return to this unit today (likely transfer to [facility] or 2H).   RD will attempt to return for additional reinforcement. Handout and RD contact information provided yesterday. If pt plan to pursue cardiac rehab, pt will also have access to RD for further reinforcement of diet as an outpatient.   Dylynn Ketner A. Mayford Knife, RD, LDN, CDCES Registered Dietitian II Certified Diabetes Care and Education Specialist Pager: [phone] After hours Pager: [phone]

## 2018-04-25 NOTE — Progress Notes (Signed)
Peripherally Inserted Central Catheter/Midline Placement  The IV Nurse has discussed with the patient and/or persons authorized to consent for the patient, the purpose of this procedure and the potential benefits and risks involved with this procedure.  The benefits include less needle sticks, lab draws from the catheter, and the patient may be discharged home with the catheter. Risks include, but not limited to, infection, bleeding, blood clot (thrombus formation), and puncture of an artery; nerve damage and irregular heartbeat and possibility to perform a PICC exchange if needed/ordered by physician.  Alternatives to this procedure were also discussed.  Bard Power PICC patient education guide, fact sheet on infection prevention and patient information card has been provided to patient /or left at bedside.    PICC/Midline Placement Documentation  PICC Triple Lumen 04/25/18 PICC Left Basilic 51 cm 2 cm (Active)  Indication for Insertion or Continuance of Line Poor Vasculature-patient has had multiple peripheral attempts or PIVs lasting less than 24 hours;Vasoactive infusions 04/25/2018  8:35 PM  Exposed Catheter (cm) 2 cm 04/25/2018  8:35 PM  Site Assessment Clean;Dry;Intact 04/25/2018  8:35 PM  Lumen #1 Status Blood return noted;Flushed;Saline locked 04/25/2018  8:35 PM  Lumen #2 Status Blood return noted;Flushed;Saline locked 04/25/2018  8:35 PM  Lumen #3 Status Blood return noted;Flushed;Saline locked 04/25/2018  8:35 PM  Dressing Type Transparent;Occlusive;Securing device 04/25/2018  8:35 PM  Dressing Status Clean;Dry;Intact;Antimicrobial disc in place 04/25/2018  8:35 PM  Dressing Intervention New dressing 04/25/2018  8:35 PM  Dressing Change Due 05/02/18 04/25/2018  8:35 PM       Netta Corrigan L 04/25/2018, 8:57 PM

## 2018-04-25 NOTE — Progress Notes (Addendum)
Patient ID: Randall Jackson, male   DOB: 1961/02/01, 58 y.o.   MRN: [id number]     Advanced Heart Failure Rounding Note  PCP-Cardiologist: No primary care provider on file.   Subjective:    Patient continues to diurese well, weight down again by about 5 lbs.  Creatinine 1.43 today. He had a run of NSVT again overnight.   RHC Procedural Findings (3/4): Hemodynamics (mmHg) RA mean 16 RV 50/18 PA 50/27, mean 40 PCWP mean 30 Oxygen saturations: PA 63% AO 95% Cardiac Output (Fick) 3.86  Cardiac Index (Fick) 1.7 PVR 2.6 WU  Echo 04/23/18 EF 20-25%, diffuse HK, RV mod reduced, RA and LA severely dilated, mild to mod MR  Objective:   Weight Range: 110.7 kg Body mass index is 33.11 kg/m.   Vital Signs:   Temp:  [97.2 F (36.2 C)-98.2 F (36.8 C)] 98.2 F (36.8 C) (03/04 0353) Pulse Rate:  [80-95] 94 (03/04 0353) Resp:  [18-20] 18 (03/04 0353) BP: (95-109)/(66-87) 95/66 (03/04 0353) SpO2:  [92 %-98 %] 97 % (03/04 0835) Weight:  [110.7 kg] 110.7 kg (03/04 0353) Last BM Date: 04/23/18  Weight change: Filed Weights   04/23/18 0414 04/24/18 0400 04/25/18 0353  Weight: 115.6 kg 113 kg 110.7 kg    Intake/Output:   Intake/Output Summary (Last 24 hours) at 04/25/2018 0931 Last data filed at 04/25/2018 9166 Gross per 24 hour  Intake 600 ml  Output 4675 ml  Net -4075 ml      Physical Exam    General: NAD Neck: JVP 12+ cm, no thyromegaly or thyroid nodule.  Lungs: Clear to auscultation bilaterally with normal respiratory effort. CV: Lateral PMI.  Heart regular S1/S2, no S3/S4, no murmur.  Trace ankle edema.   Abdomen: Soft, nontender, no hepatosplenomegaly, no distention.  Skin: Intact without lesions or rashes.  Neurologic: Alert and oriented x 3.  Psych: Normal affect. Extremities: No clubbing or cyanosis.  HEENT: Normal.    Telemetry   NSR 90s-100s.  He had a run of NSVT overnight. Personally reviewed.   EKG    No new tracings.   Labs    CBC Recent Labs   04/22/18 1620  04/24/18 0420 04/25/18 0344  WBC 13.6*   < > 10.2 9.6  NEUTROABS 8.0*  --   --   --   HGB 18.7*   < > 17.4* 18.4*  HCT 57.1*   < > 52.6* 56.3*  MCV 92.2   < > 91.8 92.6  PLT 288   < > 240 245   < > = values in this interval not displayed.   Basic Metabolic Panel Recent Labs    06/00/45 0334 04/24/18 0420 04/24/18 1047 04/25/18 0344  NA 138 136 138 139  K 3.8 5.6* 3.1* 3.6  CL 104 103 101 101  CO2 23 23 33* 27  GLUCOSE 127* 89 95 97  BUN 22* 23* 20 20  CREATININE 1.19 1.26* 1.33* 1.43*  CALCIUM 8.6* 7.8* 8.3* 8.2*  MG 1.9 2.3  --   --    Liver Function Tests No results for input(s): AST, ALT, ALKPHOS, BILITOT, PROT, ALBUMIN in the last 72 hours. No results for input(s): LIPASE, AMYLASE in the last 72 hours. Cardiac Enzymes Recent Labs    04/22/18 2152 04/23/18 0334 04/23/18 0922  TROPONINI 0.05* 0.05* 0.04*    BNP: BNP (last 3 results) Recent Labs    04/22/18 1620  BNP 954.3*    ProBNP (last 3 results) No results for input(s): PROBNP in  the last 8760 hours.   D-Dimer Recent Labs    04/22/18 1800  DDIMER 0.57*   Hemoglobin A1C Recent Labs    04/22/18 2152  HGBA1C 6.5*   Fasting Lipid Panel Recent Labs    04/22/18 2152  CHOL 112  HDL 23*  LDLCALC 71  TRIG 90  CHOLHDL 4.9   Thyroid Function Tests Recent Labs    04/22/18 2152 04/24/18 1047  TSH 6.202*  --   T3FREE  --  2.6    Other results:   Imaging    [place] Ekg Site Rite  Result Date: 04/25/2018 If Site Rite image not attached, placement could not be confirmed due to current cardiac rhythm.    Medications:     Scheduled Medications: . [MAR Hold] aspirin EC  81 mg Oral Daily  . [MAR Hold] digoxin  0.125 mg Oral Daily  . [MAR Hold] enoxaparin (LOVENOX) injection  40 mg Subcutaneous Q24H  . furosemide  80 mg Intravenous BID  . losartan  12.5 mg Oral BID  . [MAR Hold] metoprolol succinate  12.5 mg Oral BID  . potassium chloride  40 mEq Oral BID  . [MAR  Hold] rosuvastatin  40 mg Oral q1800  . [MAR Hold] sodium chloride flush  3 mL Intravenous Q12H  . sodium chloride flush  3 mL Intravenous Q12H  . [MAR Hold] spironolactone  25 mg Oral Daily    Infusions: . [MAR Hold] sodium chloride 250 mL (04/23/18 1604)  . sodium chloride    . sodium chloride 10 mL/hr at 04/25/18 0554    PRN Medications: [MAR Hold] sodium chloride, sodium chloride, [MAR Hold] acetaminophen **OR** [MAR Hold] acetaminophen, [MAR Hold] ondansetron **OR** [MAR Hold] ondansetron (ZOFRAN) IV, [MAR Hold] polyethylene glycol, [MAR Hold] sodium chloride flush, sodium chloride flush    Patient Profile   Randall Jackson is a 58 y.o. male with a history of CAD s/p RCA stent, chronic systolic HF (EF 25-30%), ICM. MI 2011,  MR, depression, tobacco use, hyperlipidemia, and medications noncompliance.  For several years he did not take CAD meds.   Admitted with A/C systolic HF.   Assessment/Plan   1. Acute on chronic systolic CHF: ?Primarily ischemic cardiomyopathy. Recent cath in 12/19 with CTO RCA, minimal disease in the LAD and LCx.  Echo this admission showed EF 20-25% with moderate dilation and diffuse hypokinesis, moderate MR, mildly decreased RV systolic function.  Echo with diffuse hypokinesis suggests either a mixed cardiomyopathy or substantial negative remodeling post-RCA occlusion. He has never been a heavy drinker.  RHC was done today, showing that filling pressures are still significantly elevated and CI is low at 1.7.  He is diuresing well, weight coming down.  - With low output, will place PICC to follow co-ox and CVP.  - Continue Lasix 80 mg IV bid, replace K and Mg. Creatinine mildly up, will have to follow closely.  - Toprol XL 12.5 mg bid.  - Continue digoxin 0.125.  - Increase losartan to 12.5 mg bid.  - Continue spironolactone 25 daily.   - Will need repeat echo in 3+ months, if EF remains low, would arrange for ICD (narrow QRS, not CRT candidate).  - I  will arrange for cardiac MRI => cardiomyopathy seems out of proportion to coronary disease (CTO RCA).  - With low output, we will need to start thinking about the possibility that he will need advanced therapies.  He has been very limited for months. I mentioned possible LVAD/transplant to him today.  2.  CAD: History of inferior MI with RCA in 2011, found to have RCA CTO on cath in 12/19 with minimal disease in LAD and LCx.   - Continue Crestor 40 mg daily.  - Continue ASA 81 daily.  3. NSVT: Keep K and Mg repleted, continue low dose Toprol XL.   - I will not increase Toprol XL with low output.  - Give extra K today and check Mg.  - May need Lifevest.  4. Noncompliance: He has been reticent to take prescription meds in the past.  We discussed the importance of medication compliance today. I broached the possible need for advanced therapies, wife has been reading about LVAD.   Marca Ancona 04/25/2018 9:31 AM

## 2018-04-26 ENCOUNTER — Inpatient Hospital Stay (HOSPITAL_COMMUNITY): Payer: BLUE CROSS/BLUE SHIELD

## 2018-04-26 DIAGNOSIS — Z7189 Other specified counseling: Secondary | ICD-10-CM

## 2018-04-26 DIAGNOSIS — Z515 Encounter for palliative care: Secondary | ICD-10-CM

## 2018-04-26 DIAGNOSIS — I429 Cardiomyopathy, unspecified: Secondary | ICD-10-CM

## 2018-04-26 LAB — CBC
HCT: 57.8 % — ABNORMAL HIGH (ref 39.0–52.0)
Hemoglobin: 19 g/dL — ABNORMAL HIGH (ref 13.0–17.0)
MCH: 30.4 pg (ref 26.0–34.0)
MCHC: 32.9 g/dL (ref 30.0–36.0)
MCV: 92.6 fL (ref 80.0–100.0)
Platelets: 229 10*3/uL (ref 150–400)
RBC: 6.24 MIL/uL — ABNORMAL HIGH (ref 4.22–5.81)
RDW: 14.4 % (ref 11.5–15.5)
WBC: 10.8 10*3/uL — ABNORMAL HIGH (ref 4.0–10.5)
nRBC: 0 % (ref 0.0–0.2)

## 2018-04-26 LAB — BASIC METABOLIC PANEL
Anion gap: 13 (ref 5–15)
BUN: 18 mg/dL (ref 6–20)
CHLORIDE: 98 mmol/L (ref 98–111)
CO2: 26 mmol/L (ref 22–32)
Calcium: 7.9 mg/dL — ABNORMAL LOW (ref 8.9–10.3)
Creatinine, Ser: 1.08 mg/dL (ref 0.61–1.24)
GFR calc Af Amer: >60 mL/min (ref >60)
GFR calc non Af Amer: >60 mL/min (ref >60)
Glucose, Bld: 160 mg/dL — ABNORMAL HIGH (ref 70–99)
Potassium: 3.7 mmol/L (ref 3.5–5.1)
Sodium: 137 mmol/L (ref 135–145)

## 2018-04-26 LAB — COOXEMETRY PANEL
Carboxyhemoglobin: 1.1 % (ref 0.5–1.5)
Methemoglobin: 0.9 % (ref 0.0–1.5)
O2 SAT: 53.7 %
Total hemoglobin: 19.8 g/dL — ABNORMAL HIGH (ref 12.0–16.0)

## 2018-04-26 LAB — MAGNESIUM: Magnesium: 2.1 mg/dL (ref 1.7–2.4)

## 2018-04-26 MED ORDER — POTASSIUM CHLORIDE CRYS ER 20 MEQ PO TBCR
40.0000 meq | EXTENDED_RELEASE_TABLET | Freq: Once | ORAL | Status: DC
  Administered 2018-04-26: 40 meq via ORAL

## 2018-04-26 MED ORDER — LOSARTAN POTASSIUM 25 MG PO TABS
25.0000 mg | ORAL_TABLET | Freq: Two times a day (BID) | ORAL | Status: DC
  Administered 2018-04-26 (×2): 25 mg via ORAL
  Filled 2018-04-26 (×2): qty 1

## 2018-04-26 MED ORDER — APIXABAN 5 MG PO TABS
5.0000 mg | ORAL_TABLET | Freq: Two times a day (BID) | ORAL | Status: DC
  Administered 2018-04-27 – 2018-04-28 (×3): 5 mg via ORAL
  Filled 2018-04-26 (×3): qty 1

## 2018-04-26 MED ORDER — GADOBUTROL 1 MMOL/ML IV SOLN
10.0000 mL | Freq: Once | INTRAVENOUS | Status: AC | PRN
  Administered 2018-04-26: 11 mL via INTRAVENOUS

## 2018-04-26 MED ORDER — APIXABAN 5 MG PO TABS
5.0000 mg | ORAL_TABLET | Freq: Once | ORAL | Status: AC
  Administered 2018-04-26: 5 mg via ORAL
  Filled 2018-04-26: qty 1

## 2018-04-26 MED ORDER — FUROSEMIDE 10 MG/ML IJ SOLN
80.0000 mg | Freq: Once | INTRAMUSCULAR | Status: AC
  Administered 2018-04-26: 80 mg via INTRAVENOUS
  Filled 2018-04-26: qty 8

## 2018-04-26 MED ORDER — HYDROCORTISONE 1 % EX CREA
1.0000 "application " | TOPICAL_CREAM | Freq: Three times a day (TID) | CUTANEOUS | Status: DC | PRN
  Filled 2018-04-26: qty 28

## 2018-04-26 NOTE — Progress Notes (Addendum)
Nutrition Education Follow-Up Note  RD re-attempted visit for diet education for CHF. Of note, RD has attempted to review diet with pt multiple times, however, has either been difficult to arouse/engage in education session or out of room for procedures. Handout has been provided with RD contact information previously.   Chart reviewed and staff report that pt is very overwhelmed today. RD will defer further education efforts. Also, palliative care has been consulted to discuss further goals of care.   If further diet education is desired and/or within pt's goals of care, please re-consult RD.   Colena Ketterman A. Mayford Knife, RD, LDN, CDCES Registered Dietitian II Certified Diabetes Care and Education Specialist Pager: [phone] After hours Pager: [phone]

## 2018-04-26 NOTE — H&P (View-Only) (Signed)
Patient ID: Randall Jackson, male   DOB: 08/22/1960, 57 y.o.   MRN: [id number]     Advanced Heart Failure Rounding Note  PCP-Cardiologist: No primary care provider on file.   Subjective:    Went for RHC yesterday, which showed elevated filling pressures and low output (see below). PICC line placed for CVP and coox monitoring. Went into atrial flutter post cath. Started on Eliquis and amio drip. Remains in atrial flutter, rate variable 70-110s, 110s while talking to him in room. Coox 54% today.   Sluggish diuresis with 80 mg IV lasix BID. I/O negative 400 mls (a lot not charted per patient). Creatinine stable 1.08. Weight down 4 lbs. CVP ~7  He had a few runs of NSVT overnight, longest 20 beats. K 3.7, mag 2.1  We discussed cath results and atrial flutter/need for AC. He is feeling very overwhelmed. He does not like to take medicine. He does not think he would want a VAD and would rather die. He is okay with palliative consult to help him better define his wishes. No CP or SOB. Felt dizzy yesterday afternoon.  Going for cardiac MRI today.   RHC Procedural Findings (3/4): Hemodynamics (mmHg) RA mean 16 RV 50/18 PA 50/27, mean 40 PCWP mean 30 Oxygen saturations: PA 63% AO 95% Cardiac Output (Fick) 3.86  Cardiac Index (Fick) 1.7 PVR 2.6 WU  Echo 04/23/18 EF 20-25%, diffuse HK, RV mod reduced, RA and LA severely dilated, mild to mod MR  Objective:   Weight Range: 109 kg Body mass index is 32.6 kg/m.   Vital Signs:   Temp:  [97.4 F (36.3 C)-99 F (37.2 C)] 97.4 F (36.3 C) (03/05 0434) Pulse Rate:  [83-241] 83 (03/05 0434) Resp:  [7-26] 26 (03/04 1225) BP: (98-125)/(73-96) 125/95 (03/05 0434) SpO2:  [0 %-98 %] 97 % (03/05 0434) Weight:  [109 kg] 109 kg (03/05 0434) Last BM Date: 04/23/18  Weight change: Filed Weights   04/24/18 0400 04/25/18 0353 04/26/18 0434  Weight: 113 kg 110.7 kg 109 kg    Intake/Output:   Intake/Output Summary (Last 24 hours) at 04/26/2018  0755 Last data filed at 04/26/2018 0300 Gross per 24 hour  Intake 743.61 ml  Output 1150 ml  Net -406.39 ml      Physical Exam    General: Appears anxious. No resp difficulty. HEENT: Normal Neck: Supple. JVP ~7. Carotids 2+ bilat; no bruits. No thyromegaly or nodule noted. Cor: PMI lateral. Irregular, tachy, No M/G/R noted Lungs: CTAB, normal effort. Abdomen: Soft, non-tender, non-distended, no HSM. No bruits or masses. +BS  Extremities: No cyanosis, clubbing, or rash. R and LLE no edema.  Neuro: Alert & orientedx3, cranial nerves grossly intact. moves all 4 extremities w/o difficulty. Affect pleasant   Telemetry   Atrial flutter 80-110s, 3 runs of NSVT, longest 20 beats. Personally reviewed.   EKG    No new tracings.   Labs    CBC Recent Labs    04/25/18 0344  04/25/18 0917 04/26/18 0619  WBC 9.6  --   --  10.8*  HGB 18.4*   < > 18.7* 19.0*  HCT 56.3*   < > 55.0* 57.8*  MCV 92.6  --   --  92.6  PLT 245  --   --  229   < > = values in this interval not displayed.   Basic Metabolic Panel Recent Labs    04/25/18 0344  04/25/18 0917 04/26/18 0619  NA 139   < > 141 137    K 3.6   < > 3.7 3.7  CL 101  --   --  98  CO2 27  --   --  26  GLUCOSE 97  --   --  160*  BUN 20  --   --  18  CREATININE 1.36*  1.43*  --   --  1.08  CALCIUM 8.2*  --   --  7.9*  MG 2.1  --   --  2.1   < > = values in this interval not displayed.   Liver Function Tests No results for input(s): AST, ALT, ALKPHOS, BILITOT, PROT, ALBUMIN in the last 72 hours. No results for input(s): LIPASE, AMYLASE in the last 72 hours. Cardiac Enzymes Recent Labs    04/23/18 0922  TROPONINI 0.04*    BNP: BNP (last 3 results) Recent Labs    04/22/18 1620  BNP 954.3*    ProBNP (last 3 results) No results for input(s): PROBNP in the last 8760 hours.   D-Dimer No results for input(s): DDIMER in the last 72 hours. Hemoglobin A1C No results for input(s): HGBA1C in the last 72 hours. Fasting  Lipid Panel No results for input(s): CHOL, HDL, LDLCALC, TRIG, CHOLHDL, LDLDIRECT in the last 72 hours. Thyroid Function Tests Recent Labs    04/24/18 1047  T3FREE 2.6    Other results:   Imaging    [place] Ekg Site Rite  Result Date: 04/25/2018 If Site Rite image not attached, placement could not be confirmed due to current cardiac rhythm.    Medications:     Scheduled Medications: . apixaban  5 mg Oral BID  . aspirin EC  81 mg Oral Daily  . digoxin  0.125 mg Oral Daily  . furosemide  80 mg Intravenous BID  . losartan  12.5 mg Oral BID  . metoprolol succinate  12.5 mg Oral BID  . potassium chloride  40 mEq Oral BID  . rosuvastatin  40 mg Oral q1800  . sodium chloride flush  10-40 mL Intracatheter Q12H  . sodium chloride flush  3 mL Intravenous Q12H  . sodium chloride flush  3 mL Intravenous Q12H  . spironolactone  25 mg Oral Daily    Infusions: . sodium chloride Stopped (04/23/18 1703)  . sodium chloride    . amiodarone 30 mg/hr (04/26/18 0034)    PRN Medications: sodium chloride, sodium chloride, acetaminophen, ondansetron (ZOFRAN) IV, polyethylene glycol, sodium chloride flush, sodium chloride flush, sodium chloride flush    Patient Profile   Randall Jackson is a 58 y.o. male with a history of CAD s/p RCA stent, chronic systolic HF (EF 25-30%), ICM. MI 2011,  MR, depression, tobacco use, hyperlipidemia, and medications noncompliance.  For several years he did not take CAD meds.   Admitted with A/C systolic HF.   Assessment/Plan   1. Acute on chronic systolic CHF: ?Primarily ischemic cardiomyopathy. Recent cath in 12/19 with CTO RCA, minimal disease in the LAD and LCx.  Echo this admission showed EF 20-25% with moderate dilation and diffuse hypokinesis, moderate MR, mildly decreased RV systolic function.  Echo with diffuse hypokinesis suggests either a mixed cardiomyopathy or substantial negative remodeling post-RCA occlusion. He has never been a heavy  drinker.  RHC 3/4, showing that filling pressures are still significantly elevated and CI is low at 1.7.   - Coox on low side at 54% today. Volume much better. Likely because of atrial flutter.  - Volume improved. CVP ~7. Creatinine 1.08 - Give one more dose of 80  mg IV lasix. Likely to PO tomorrow (torsemide 40 daily).  - Continue Toprol XL 12.5 mg bid.  - Continue digoxin 0.125.  - Increase losartan to 25 mg BID. SBP 90-100s generally, but 120s overnight. Hopefully we can transition him to [facility].  - Continue spironolactone 25 daily.   - Will need repeat echo in 3+ months, if EF remains low, would arrange for ICD (narrow QRS, not CRT candidate).  - Cardiac MRI ordered for today => cardiomyopathy seems out of proportion to coronary disease (CTO RCA).  - With low output, we will need to start thinking about the possibility that he will need advanced therapies.  He has been very limited for months. Dr Shirlee Latch mentioned possible LVAD/transplant to him. We discussed again today and he says he is not interested. He says he would rather die. I will have palliative come talk to him to better define his goals/wishes. He is agreeable.   2. CAD: History of inferior MI with RCA in 2011, found to have RCA CTO on cath in 12/19 with minimal disease in LAD and LCx.   - Continue Crestor 40 mg daily.  - Continue ASA 81 daily. Can stop now that he's on Eliquis.  - No s/s ischemia.  3. NSVT: Keep K and Mg repleted, continue low dose Toprol XL.   - Will not increase Toprol XL with low output.  - K 3.7. Supp. Mag 2.1 - Will likely need Lifevest.   4. New Atrial Flutter - Started on Eliquis and amio drip yesterday. - Remains in atrial flutter 100s this morning.  - Discussed that he will need to be on Eliquis long term.  - Will probably need DCCV tomorrow if he does not come out of Afib. Will discuss with MD.  5. Noncompliance: He has been reticent to take prescription meds in the past.  We discussed the  importance of medication compliance today. I broached the possible need for advanced therapies, wife has been reading about LVAD.   Alford Highland, NP 04/26/2018 7:55 AM  Patient seen with NP, agree with the above note.   He went into atrial flutter yesterday after cath. He has been on amiodarone gtt without conversion, and Eliquis was started.  Breathing is improved, CVP 7 when checked this morning.   On exam, JVP 8 cm.  Mildly tachy, irregular S1S2, no S3.  Clear lungs.  No edema.   Co-ox low this morning at 54% with CVP 7, but patient is in atrial flutter with mild RVR.  - Need to convert to NSR, see below.  - IV lasix this morning, transition to torsemide 40 mg daily tomorrow.  - Continue current Toprol XL, digoxin, spironolactone.  - Increase losartan to 25 mg bid. If BP remains stable, will transition to [facility] possibly tomorrow.  - Low output on cath yesterday is concerning.  We discussed possible need for advanced therapies.  We will aim to improve him as much as possible with meds, but if needed, he is willing to undergo evaluation for advanced therapies (says he refused consideration earlier as he was overwhelmed with everything going on).   He remains in atrial flutter, now on amiodarone gtt and Eliquis.  He is scheduled for DCCV tomorrow morning if he does not convert on amiodarone (will be within 48 hrs of onset of atrial flutter).   Ongoing NSVT, hopefully amiodarone will help. Probably home with Lifevest.   Marca Ancona 04/26/2018 1:44 PM

## 2018-04-26 NOTE — Progress Notes (Signed)
Family Medicine Teaching Service Daily Progress Note Intern Pager: [phone]  Patient name: Randall Jackson Medical record number: [id number] Date of birth: 1960/11/03 Age: 58 y.o. Gender: male  Primary Care Provider: Laren Boom, DO Consultants: cardiology Code Status: full  Pt Overview and Major Events to Date:  3/1 - admitted for decompensated heart failure  Assessment and Plan: Randall Jackson is a 58 y.o. male presenting with increasing dyspnea on exertion, orthopnea, cough, and leg swelling concerning for worsening CHF .   Acute decompensated heart failure, with reduced ejection fraction  Heart failure team suspects primarily ischemic cardiomyopathy.  1.1 L urine output 3/4.  13.3 L urine output for hospitalization, net -10 L down from admission.  Total weight loss of 8.9 kg. -IV lasix 80 mg BID, consider transition to p.o. following cath -spironolactone 25 mg, hold for hyperkalemia -Toprol-XL 12.5 mg BID -digoxin 0.125 mg daily -losartan 12.5 mg -We will need repeat echocardiogram in 3 months.  With further interventions necessary if EF remains low. -Refer to cardiac rehab -MRI cardiac morphology with and without contrast ordered by cardiology to assess cardiomyopathy of uncertain etiology.  Sustained atrial flutter He experienced sustained atrial flutter following his right heart catheterization on 3/4.  He was started on amiodarone drip. -Continue amiodarone  Hyperkalemia-stable Potassium 3.6 this a.m. -Monitor BMP  -Replete potassium PRN  Nonsustained ventricular tachycardia Frequent episodes of nonsustained V. tach overnight.  Amiodarone drip started 3/4. -Continue cardiac monitors  CAD Risk stratification labs show total cholesterol of 112, LDL 71.  No adjustment necessary on a statin regimen. -Lipitor  AKI Mild increase in creatinine overnight from 1.33-1.43.  BUN/creatinine ratio greater than 20 indicates likely prerenal etiology.  Likely secondary to ongoing  diuresis. -Monitor with daily's BMP -Avoid nephrotoxic agents  Concern for hypothyroidism TSH from a stratification labs noted to be 6.202 -Follow-up T3/T4 outpatient  FEN/GI: Heart healthy diet PPx: Lovenox, Zofran PRN  Disposition: Possible DC 3/5  Subjective:  Feeling overwhelmed this morning.  He reports that he came in thinking he was relatively healthy guy and now is discussing heart transplant and LVAD.  Objective: Temp:  [97.4 F (36.3 C)-99 F (37.2 C)] 97.4 F (36.3 C) (03/05 0434) Pulse Rate:  [83-241] 83 (03/05 0434) Resp:  [7-26] 26 (03/04 1225) BP: (98-125)/(73-96) 125/95 (03/05 0434) SpO2:  [0 %-98 %] 97 % (03/05 0434) Weight:  [287 kg] 109 kg (03/05 0434)  Physical Exam: General: Alert and cooperative and appears to be in no acute distress HEENT: MMM, JVD slightly below the angle of the jaw, decreased from prior Cardio: Normal S1 and S2, no S3 or S4. Rhythm is regular. No murmurs or rubs.   Pulm: Clear to auscultation bilaterally, no crackles, wheezing, or diminished breath sounds. Normal respiratory effort Abdomen: Bowel sounds normal. Abdomen soft and non-tender.  Extremities: No peripheral edema. Warm/ well perfused.  Strong radial pulse. Neuro: Cranial nerves grossly intact   Laboratory: Recent Labs  Lab 04/23/18 0334 04/24/18 0420 04/25/18 0344 04/25/18 0916 04/25/18 0917  WBC 11.3* 10.2 9.6  --   --   HGB 18.1* 17.4* 18.4* 19.0* 18.7*  HCT 54.2* 52.6* 56.3* 56.0* 55.0*  PLT 233 240 245  --   --    Recent Labs  Lab 04/24/18 0420 04/24/18 1047 04/25/18 0344 04/25/18 0916 04/25/18 0917  NA 136 138 139 140 141  K 5.6* 3.1* 3.6 3.8 3.7  CL 103 101 101  --   --   CO2 23 33* 27  --   --  BUN 23* 20 20  --   --   CREATININE 1.26* 1.33* 1.36*  1.43*  --   --   CALCIUM 7.8* 8.3* 8.2*  --   --   GLUCOSE 89 95 97  --   --       Imaging/Diagnostic Tests: Dg Chest Port 1 View  Result Date: 04/22/2018 CLINICAL DATA:  Acute shortness of  breath and chest pain. EXAM: PORTABLE CHEST 1 VIEW COMPARISON:  None. FINDINGS: Cardiomegaly and pulmonary vascular congestion noted. There is no evidence of focal airspace disease, pulmonary edema, suspicious pulmonary nodule/mass, pleural effusion, or pneumothorax. No acute bony abnormalities are identified. IMPRESSION: Cardiomegaly with pulmonary vascular congestion. Electronically Signed   By: Harmon Pier M.D.   On: 04/22/2018 17:20   [place] Ekg Site Rite  Result Date: 04/25/2018 If Site Rite image not attached, placement could not be confirmed due to current cardiac rhythm.    Mirian Mo, MD 04/26/2018, 6:43 AM PGY-1, [place] [facility] FPTS Intern pager: [phone], text pages welcome

## 2018-04-26 NOTE — Progress Notes (Signed)
PT Cancellation Note  Patient Details Name: Randall Jackson MRN: [id number] DOB: 27-Jul-1960   Cancelled Treatment:    Reason Eval/Treat Not Completed: Patient at procedure or test/unavailable. Will check back as able.     [name] 04/26/2018, 12:24 PM [name],PT Acute Rehabilitation Services Pager:  [phone]  Office:  [phone]

## 2018-04-26 NOTE — Progress Notes (Addendum)
Patient ID: Randall Jackson, male   DOB: September 08, 1960, 58 y.o.   MRN: [id number]     Advanced Heart Failure Rounding Note  PCP-Cardiologist: No primary care provider on file.   Subjective:    Went for RHC yesterday, which showed elevated filling pressures and low output (see below). PICC line placed for CVP and coox monitoring. Went into atrial flutter post cath. Started on Eliquis and amio drip. Remains in atrial flutter, rate variable 70-110s, 110s while talking to him in room. Coox 54% today.   Sluggish diuresis with 80 mg IV lasix BID. I/O negative 400 mls (a lot not charted per patient). Creatinine stable 1.08. Weight down 4 lbs. CVP ~7  He had a few runs of NSVT overnight, longest 20 beats. K 3.7, mag 2.1  We discussed cath results and atrial flutter/need for [facility]. He is feeling very overwhelmed. He does not like to take medicine. He does not think he would want a VAD and would rather die. He is okay with palliative consult to help him better define his wishes. No CP or SOB. Felt dizzy yesterday afternoon.  Going for cardiac MRI today.   RHC Procedural Findings (3/4): Hemodynamics (mmHg) RA mean 16 RV 50/18 PA 50/27, mean 40 PCWP mean 30 Oxygen saturations: PA 63% AO 95% Cardiac Output (Fick) 3.86  Cardiac Index (Fick) 1.7 PVR 2.6 WU  Echo 04/23/18 EF 20-25%, diffuse HK, RV mod reduced, RA and LA severely dilated, mild to mod MR  Objective:   Weight Range: 109 kg Body mass index is 32.6 kg/m.   Vital Signs:   Temp:  [97.4 F (36.3 C)-99 F (37.2 C)] 97.4 F (36.3 C) (03/05 0434) Pulse Rate:  [83-241] 83 (03/05 0434) Resp:  [7-26] 26 (03/04 1225) BP: (98-125)/(73-96) 125/95 (03/05 0434) SpO2:  [0 %-98 %] 97 % (03/05 0434) Weight:  [109 kg] 109 kg (03/05 0434) Last BM Date: 04/23/18  Weight change: Filed Weights   04/24/18 0400 04/25/18 0353 04/26/18 0434  Weight: 113 kg 110.7 kg 109 kg    Intake/Output:   Intake/Output Summary (Last 24 hours) at 04/26/2018  0755 Last data filed at 04/26/2018 0300 Gross per 24 hour  Intake 743.61 ml  Output 1150 ml  Net -406.39 ml      Physical Exam    General: Appears anxious. No resp difficulty. HEENT: Normal Neck: Supple. JVP ~7. Carotids 2+ bilat; no bruits. No thyromegaly or nodule noted. Cor: PMI lateral. Irregular, tachy, No M/G/R noted Lungs: CTAB, normal effort. Abdomen: Soft, non-tender, non-distended, no HSM. No bruits or masses. +BS  Extremities: No cyanosis, clubbing, or rash. R and LLE no edema.  Neuro: Alert & orientedx3, cranial nerves grossly intact. moves all 4 extremities w/o difficulty. Affect pleasant   Telemetry   Atrial flutter 80-110s, 3 runs of NSVT, longest 20 beats. Personally reviewed.   EKG    No new tracings.   Labs    CBC Recent Labs    04/25/18 0344  04/25/18 0917 04/26/18 0619  WBC 9.6  --   --  10.8*  HGB 18.4*   < > 18.7* 19.0*  HCT 56.3*   < > 55.0* 57.8*  MCV 92.6  --   --  92.6  PLT 245  --   --  229   < > = values in this interval not displayed.   Basic Metabolic Panel Recent Labs    28/41/32 0344  04/25/18 0917 04/26/18 0619  NA 139   < > 141 137  K 3.6   < > 3.7 3.7  CL 101  --   --  98  CO2 27  --   --  26  GLUCOSE 97  --   --  160*  BUN 20  --   --  18  CREATININE 1.36*  1.43*  --   --  1.08  CALCIUM 8.2*  --   --  7.9*  MG 2.1  --   --  2.1   < > = values in this interval not displayed.   Liver Function Tests No results for input(s): AST, ALT, ALKPHOS, BILITOT, PROT, ALBUMIN in the last 72 hours. No results for input(s): LIPASE, AMYLASE in the last 72 hours. Cardiac Enzymes Recent Labs    04/23/18 0922  TROPONINI 0.04*    BNP: BNP (last 3 results) Recent Labs    04/22/18 1620  BNP 954.3*    ProBNP (last 3 results) No results for input(s): PROBNP in the last 8760 hours.   D-Dimer No results for input(s): DDIMER in the last 72 hours. Hemoglobin A1C No results for input(s): HGBA1C in the last 72 hours. Fasting  Lipid Panel No results for input(s): CHOL, HDL, LDLCALC, TRIG, CHOLHDL, LDLDIRECT in the last 72 hours. Thyroid Function Tests Recent Labs    04/24/18 1047  T3FREE 2.6    Other results:   Imaging    [place] Ekg Site Rite  Result Date: 04/25/2018 If Site Rite image not attached, placement could not be confirmed due to current cardiac rhythm.    Medications:     Scheduled Medications: . apixaban  5 mg Oral BID  . aspirin EC  81 mg Oral Daily  . digoxin  0.125 mg Oral Daily  . furosemide  80 mg Intravenous BID  . losartan  12.5 mg Oral BID  . metoprolol succinate  12.5 mg Oral BID  . potassium chloride  40 mEq Oral BID  . rosuvastatin  40 mg Oral q1800  . sodium chloride flush  10-40 mL Intracatheter Q12H  . sodium chloride flush  3 mL Intravenous Q12H  . sodium chloride flush  3 mL Intravenous Q12H  . spironolactone  25 mg Oral Daily    Infusions: . sodium chloride Stopped (04/23/18 1703)  . sodium chloride    . amiodarone 30 mg/hr (04/26/18 0034)    PRN Medications: sodium chloride, sodium chloride, acetaminophen, ondansetron (ZOFRAN) IV, polyethylene glycol, sodium chloride flush, sodium chloride flush, sodium chloride flush    Patient Profile   Randall Jackson is a 58 y.o. male with a history of CAD s/p RCA stent, chronic systolic HF (EF 25-30%), ICM. MI 2011,  MR, depression, tobacco use, hyperlipidemia, and medications noncompliance.  For several years he did not take CAD meds.   Admitted with A/C systolic HF.   Assessment/Plan   1. Acute on chronic systolic CHF: ?Primarily ischemic cardiomyopathy. Recent cath in 12/19 with CTO RCA, minimal disease in the LAD and LCx.  Echo this admission showed EF 20-25% with moderate dilation and diffuse hypokinesis, moderate MR, mildly decreased RV systolic function.  Echo with diffuse hypokinesis suggests either a mixed cardiomyopathy or substantial negative remodeling post-RCA occlusion. He has never been a heavy  drinker.  RHC 3/4, showing that filling pressures are still significantly elevated and CI is low at 1.7.   - Coox on low side at 54% today. Volume much better. Likely because of atrial flutter.  - Volume improved. CVP ~7. Creatinine 1.08 - Give one more dose of 80  mg IV lasix. Likely to PO tomorrow (torsemide 40 daily).  - Continue Toprol XL 12.5 mg bid.  - Continue digoxin 0.125.  - Increase losartan to 25 mg BID. SBP 90-100s generally, but 120s overnight. Hopefully we can transition him to [facility].  - Continue spironolactone 25 daily.   - Will need repeat echo in 3+ months, if EF remains low, would arrange for ICD (narrow QRS, not CRT candidate).  - Cardiac MRI ordered for today => cardiomyopathy seems out of proportion to coronary disease (CTO RCA).  - With low output, we will need to start thinking about the possibility that he will need advanced therapies.  He has been very limited for months. Dr Shirlee Latch mentioned possible LVAD/transplant to him. We discussed again today and he says he is not interested. He says he would rather die. I will have palliative come talk to him to better define his goals/wishes. He is agreeable.   2. CAD: History of inferior MI with RCA in 2011, found to have RCA CTO on cath in 12/19 with minimal disease in LAD and LCx.   - Continue Crestor 40 mg daily.  - Continue ASA 81 daily. Can stop now that he's on Eliquis.  - No s/s ischemia.  3. NSVT: Keep K and Mg repleted, continue low dose Toprol XL.   - Will not increase Toprol XL with low output.  - K 3.7. Supp. Mag 2.1 - Will likely need Lifevest.   4. New Atrial Flutter - Started on Eliquis and amio drip yesterday. - Remains in atrial flutter 100s this morning.  - Discussed that he will need to be on Eliquis long term.  - Will probably need DCCV tomorrow if he does not come out of Afib. Will discuss with MD.  5. Noncompliance: He has been reticent to take prescription meds in the past.  We discussed the  importance of medication compliance today. I broached the possible need for advanced therapies, wife has been reading about LVAD.   Alford Highland, NP 04/26/2018 7:55 AM  Patient seen with NP, agree with the above note.   He went into atrial flutter yesterday after cath. He has been on amiodarone gtt without conversion, and Eliquis was started.  Breathing is improved, CVP 7 when checked this morning.   On exam, JVP 8 cm.  Mildly tachy, irregular S1S2, no S3.  Clear lungs.  No edema.   Co-ox low this morning at 54% with CVP 7, but patient is in atrial flutter with mild RVR.  - Need to convert to NSR, see below.  - IV lasix this morning, transition to torsemide 40 mg daily tomorrow.  - Continue current Toprol XL, digoxin, spironolactone.  - Increase losartan to 25 mg bid. If BP remains stable, will transition to [facility] possibly tomorrow.  - Low output on cath yesterday is concerning.  We discussed possible need for advanced therapies.  We will aim to improve him as much as possible with meds, but if needed, he is willing to undergo evaluation for advanced therapies (says he refused consideration earlier as he was overwhelmed with everything going on).   He remains in atrial flutter, now on amiodarone gtt and Eliquis.  He is scheduled for DCCV tomorrow morning if he does not convert on amiodarone (will be within 48 hrs of onset of atrial flutter).   Ongoing NSVT, hopefully amiodarone will help. Probably home with Lifevest.   Marca Ancona 04/26/2018 1:44 PM

## 2018-04-26 NOTE — Progress Notes (Signed)
Filled out DME and faxed [employer] form. Will plan to speak with patient about this tomorrow as he has been overwhelmed with all that we have talked about today. I let [employer] rep know to not reach out to him today.  Alford Highland, NP

## 2018-04-26 NOTE — Care Management (Signed)
#    7.   S/W   JAKE @ [facility] # [phone]   APIXABAN: NON-FORMULARY   1. ELIQUIS  5 MG BID COVER- YES CO-PAY- $ 20.00   Q/L TWO   PILL PER DAYS TIER- 3 DRUG PRIOR APPROVAL- NO  2. ELIQUIS  2.5 MG BID COVER- YES CO-PAY- $ 20.00   Q/L TWO PILL PER DAY TIER- 3 DRUG PRIOR APPROVAL- NO  PREFERRED PHARMACY : YES [facility]  AND [facility]

## 2018-04-26 NOTE — Anesthesia Preprocedure Evaluation (Addendum)
Anesthesia Evaluation  Patient identified by MRN, date of birth, ID band Patient awake    Reviewed: Allergy & Precautions, NPO status , Patient's Chart, lab work & pertinent test results  Airway Mallampati: II  TM Distance: >3 FB Neck ROM: Full    Dental no notable dental hx. (+) Teeth Intact   Pulmonary shortness of breath, former smoker,    Pulmonary exam normal breath sounds clear to auscultation       Cardiovascular hypertension, Pt. on home beta blockers and Pt. on medications + CAD, + Past MI and +CHF  Normal cardiovascular exam+ dysrhythmias Atrial Fibrillation  Rhythm:Regular Rate:Normal  04/23/2018 ECHO  1. The left ventricle has severely reduced systolic function, with an ejection fraction of 20-25%. The cavity size was severely dilated. Left ventricular diastolic Doppler parameters are consistent with pseudonormalization Elevated mean left atrial  pressure Left ventricular diffuse hypokinesis.  2. The right ventricle has moderately reduced systolic function. The cavity was normal. There is no increase in right ventricular wall thickness. Right ventricular systolic pressure is moderately elevated with an estimated pressure of 45.7 mmHg.  3. Left atrial size was severely dilated.  4. Right atrial size was severely dilated.  5. The mitral valve is normal in structure. Mitral valve regurgitation is mild to moderate by color flow Doppler   Neuro/Psych PSYCHIATRIC DISORDERS Depression    GI/Hepatic Neg liver ROS, hiatal hernia,   Endo/Other    Renal/GU Renal diseaseHx of nephrolithiasis Cr 1.08 K 3.7     Musculoskeletal negative musculoskeletal ROS (+)   Abdominal (+) + obese,   Peds  Hematology Hgb 19   Anesthesia Other Findings   Reproductive/Obstetrics                            Anesthesia Physical Anesthesia Plan  ASA: IV  Anesthesia Plan: General   Post-op Pain Management:     Induction: Intravenous  PONV Risk Score and Plan: Treatment may vary due to age or medical condition  Airway Management Planned: Natural Airway and Nasal Cannula  Additional Equipment:   Intra-op Plan:   Post-operative Plan:   Informed Consent: I have reviewed the patients History and Physical, chart, labs and discussed the procedure including the risks, benefits and alternatives for the proposed anesthesia with the patient or authorized representative who has indicated his/her understanding and acceptance.     Dental advisory given  Plan Discussed with: CRNA  Anesthesia Plan Comments: (Cardioversion)       Anesthesia Quick Evaluation

## 2018-04-26 NOTE — Progress Notes (Signed)
Patient having frequent episodes of non-sustained VT.  Pt asymptomatic.  Dr. Vonzella Nipple notified.  Instructed to continue to monitor patient and to notify him patient becomes symptomatic.

## 2018-04-26 NOTE — Care Management Note (Addendum)
Case Management Note  Patient Details  Name: [name] MRN: [id number] Date of Birth: 1961-01-23  Subjective/Objective:   Pt presented for shortness of breath and leg swelling.                  Action/Plan: CM consulted for Life Vest. Information faxed to [facility]. CM will continue to monitor for additional transition of care needs.   Expected Discharge Date:                  Expected Discharge Plan:  Home/Self Care  In-House Referral:  NA  Discharge planning Services  CM Consult  Post Acute Care Choice:  Durable Medical Equipment Choice offered to:  NA  DME Arranged:  Life vest DME Agency:  [facility]  HH Arranged:  NA HH Agency:     Status of Service:  Completed, signed off  If discussed at Long Length of Stay Meetings, dates discussed:    Additional Comments: 1618 04-27-18 Tomi Bamberger, RN,BSN [phone] Received call from [employer] Rep- patient is agreeable. Patient will be fit 04-27-18. No further needs from CM at this time.  Gala Lewandowsky, RN 04/26/2018, 2:56 PM

## 2018-04-26 NOTE — Consult Note (Signed)
Consultation Note Date: 04/26/2018   Patient Name: Randall Jackson  DOB: [birth year]  MRN: [id number]  Age / Sex: 58 y.o., male  PCP: Marcial Pacas, DO Referring Physician: Dickie La, MD  Reason for Consultation: Establishing goals of care  HPI/Patient Profile: 58 y.o. male  with past medical history of CHF (EF 20-25%), MI, CAD, VT, acute kidney injury, lumbar radiculopathy, depression, tobacco abuse admitted on 04/22/2018 with SOB, leg swelling r/t CHF exacerbation. Hospitalization complicated by atrial fibrillation on amiodarone infusion and plans for cardioversion tomorrow. Completed cardiac MRI today 04/26/18.   Clinical Assessment and Goals of Care: I met today with "[name]." He shares with me that he grew up near [place], [place] and moved to [place]. He and his wife moved to [facility] from [place]. He has an adult daughter who is very successful in the [employer]. He has had many jobs and was a very avid skier (says he almost went to the Olympics).   He mainly discusses his experience with healthcare and has had bad experiences with past facilities and physicians. He practices very holistic treatments and is opposed to medicines. He has frequently stopped medications against medical advice. He tells me today that he knows that he needs to take his medication now. He is very overwhelmed with the idea of needing an anticoagulant. Extremely overwhelmed with idea of LVAD. He shares that he has been reading about LVAD. He does not want LVAD BUT says that if he was going to die without LVAD he would consider "because I don't want to leave my wife here alone." He personally says he would rather die than have LVAD if it weren't for his wife.   He is very hopeful that he can begin to eat well and exercise and make lifestyle changes. He believes if he does this than he will improve as he did in the past. He is very hopeful for  improvement. Seems anxious with his declining health.   Emotional support provided.    Primary Decision Maker PATIENT    SUMMARY OF RECOMMENDATIONS   - Hopeful for improvement - Would potentially be interested in LVAD with continued decline - He would greatly struggle with LVAD and would be concerned with medication compliance  Code Status/Advance Care Planning:  Full code   Symptom Management:   Per heart failure team.   Palliative Prophylaxis:   Delirium Protocol  Additional Recommendations (Limitations, Scope, Preferences):  Full Scope Treatment  Psycho-social/Spiritual:   Desire for further Chaplaincy support:no  Additional Recommendations: Caregiving  Support/Resources  Prognosis:   Unable to determine  Discharge Planning: To Be Determined      Primary Diagnoses: Present on Admission: **None**   I have reviewed the medical record, interviewed the patient and family, and examined the patient. The following aspects are pertinent.  Past Medical History:  Diagnosis Date  . CAD (coronary artery disease)   . Cardiomyopathy, ischemic 06/05/2013   EF 26% April 2015, Dr. Paulla Fore @ [place]   . Chest pain   . CHF (congestive  heart failure) ([place])   . Depression 02/13/2015  . Dyspnea    WITH CRUSHING  . H/O right coronary artery stent placement 06/26/2012   March 2015: Dr. Paulla Fore planning on [place] and possible cath for pre-op testing for Dr Ronnald Ramp' back surgery [facility])   . Heart attack ([place])   . History of kidney stones   . History of pulmonary artery stenosis 06/26/2012  . Hyperlipidemia   . Lumbar radiculopathy 06/26/2012   April 2014: Degenerative changes at L5-S1 with grade 1 spondylolisthesis as a result of bilateral L5 pars interarticularis defects. These changes result in moderate to advanced bilateral foraminal narrowing with deformity of the exiting bilateral L5 nerve roots..    . MI (myocardial infarction) ([place])   . PVC (premature  ventricular contraction)   . Saddle anesthesia 06/26/2012  . Sinus tachycardia   . SOB (shortness of breath) on exertion   . Tobacco use   . Urinary incontinence 06/26/2012   Social History   Socioeconomic History  . Marital status: Married    Spouse name: Not on file  . Number of children: Not on file  . Years of education: Not on file  . Highest education level: Not on file  Occupational History  . Not on file  Social Needs  . Financial resource strain: Not on file  . Food insecurity:    Worry: Not on file    Inability: Not on file  . Transportation needs:    Medical: Not on file    Non-medical: Not on file  Tobacco Use  . Smoking status: Former Research scientist (life sciences)  . Smokeless tobacco: Never Used  Substance and Sexual Activity  . Alcohol use: Not Currently    Alcohol/week: 1.0 standard drinks    Types: 1 Standard drinks or equivalent per week  . Drug use: No  . Sexual activity: Not Currently  Lifestyle  . Physical activity:    Days per week: Not on file    Minutes per session: Not on file  . Stress: Not on file  Relationships  . Social connections:    Talks on phone: Not on file    Gets together: Not on file    Attends religious service: Not on file    Active member of club or organization: Not on file    Attends meetings of clubs or organizations: Not on file    Relationship status: Not on file  Other Topics Concern  . Not on file  Social History Narrative  . Not on file   Family History  Problem Relation Age of Onset  . Diabetes Mother        father   Scheduled Meds: . [START ON 04/27/2018] apixaban  5 mg Oral BID  . apixaban  5 mg Oral Once  . digoxin  0.125 mg Oral Daily  . losartan  25 mg Oral BID  . metoprolol succinate  12.5 mg Oral BID  . potassium chloride  40 mEq Oral BID  . rosuvastatin  40 mg Oral q1800  . sodium chloride flush  10-40 mL Intracatheter Q12H  . sodium chloride flush  3 mL Intravenous Q12H  . sodium chloride flush  3 mL Intravenous Q12H  .  spironolactone  25 mg Oral Daily   Continuous Infusions: . sodium chloride Stopped (04/23/18 1703)  . sodium chloride    . amiodarone 30 mg/hr (04/26/18 1030)   PRN Meds:.sodium chloride, sodium chloride, acetaminophen, hydrocortisone cream, ondansetron (ZOFRAN) IV, polyethylene glycol, sodium chloride flush, sodium  chloride flush, sodium chloride flush Allergies  Allergen Reactions  . Codeine Anaphylaxis and Swelling  . Gabapentin Other (See Comments)    Cognitive issues   Review of Systems  Constitutional: Positive for activity change and fatigue.  Respiratory: Positive for shortness of breath.   Cardiovascular: Positive for leg swelling.    Physical Exam Vitals signs and nursing note reviewed.  Constitutional:      General: He is not in acute distress. Cardiovascular:     Rate and Rhythm: Normal rate. Rhythm irregularly irregular.  Pulmonary:     Effort: Pulmonary effort is normal. No tachypnea, accessory muscle usage or respiratory distress.  Neurological:     Mental Status: He is alert and oriented to person, place, and time.     Vital Signs: BP 121/79   Pulse 61   Temp (!) 97.4 F (36.3 C) (Oral)   Resp (!) 26   Ht 6' (1.829 m)   Wt 109 kg   SpO2 92%   BMI 32.60 kg/m  Pain Scale: 0-10   Pain Score: 0-No pain   SpO2: SpO2: 92 % O2 Device:SpO2: 92 % O2 Flow Rate: .O2 Flow Rate (L/min): 2 L/min  IO: Intake/output summary:   Intake/Output Summary (Last 24 hours) at 04/26/2018 1534 Last data filed at 04/26/2018 1300 Gross per 24 hour  Intake 1267.96 ml  Output 925 ml  Net 342.96 ml    LBM: Last BM Date: 04/23/18 Baseline Weight: Weight: 117.9 kg Most recent weight: Weight: 109 kg     Palliative Assessment/Data: 70%     Time In: 1500 Time Out: 1600 Time Total: 60 min Greater than 50%  of this time was spent counseling and coordinating care related to the above assessment and plan.  Signed by: Vinie Sill, NP Palliative Medicine Team Pager #  [phone] (M-F 8a-5p) Team Phone # [phone] (Nights/Weekends)

## 2018-04-27 ENCOUNTER — Inpatient Hospital Stay (HOSPITAL_COMMUNITY): Payer: BLUE CROSS/BLUE SHIELD | Admitting: Anesthesiology

## 2018-04-27 ENCOUNTER — Encounter (HOSPITAL_COMMUNITY)
Admission: EM | Disposition: A | Discharge status: Home / Self Care | Payer: Self-pay | Source: Home / Self Care | Attending: Family Medicine

## 2018-04-27 ENCOUNTER — Encounter (HOSPITAL_COMMUNITY): Payer: Self-pay | Admitting: *Deleted

## 2018-04-27 DIAGNOSIS — Z515 Encounter for palliative care: Secondary | ICD-10-CM

## 2018-04-27 DIAGNOSIS — I4892 Unspecified atrial flutter: Secondary | ICD-10-CM

## 2018-04-27 DIAGNOSIS — Z7189 Other specified counseling: Secondary | ICD-10-CM

## 2018-04-27 DIAGNOSIS — I483 Typical atrial flutter: Secondary | ICD-10-CM

## 2018-04-27 HISTORY — PX: CARDIOVERSION: SHX1299

## 2018-04-27 LAB — BASIC METABOLIC PANEL
Anion gap: 11 (ref 5–15)
BUN: 17 mg/dL (ref 6–20)
CALCIUM: 8 mg/dL — AB (ref 8.9–10.3)
CO2: 25 mmol/L (ref 22–32)
Chloride: 100 mmol/L (ref 98–111)
Creatinine, Ser: 1.09 mg/dL (ref 0.61–1.24)
GFR calc Af Amer: >60 mL/min (ref >60)
GFR calc non Af Amer: >60 mL/min (ref >60)
Glucose, Bld: 194 mg/dL — ABNORMAL HIGH (ref 70–99)
Potassium: 4.2 mmol/L (ref 3.5–5.1)
Sodium: 136 mmol/L (ref 135–145)

## 2018-04-27 LAB — MAGNESIUM: Magnesium: 2.1 mg/dL (ref 1.7–2.4)

## 2018-04-27 LAB — DIGOXIN LEVEL: DIGOXIN LVL: 0.3 ng/mL — AB (ref 0.8–2.0)

## 2018-04-27 LAB — COOXEMETRY PANEL
Carboxyhemoglobin: 1 % (ref 0.5–1.5)
Carboxyhemoglobin: 0.8 % (ref 0.5–1.5)
Methemoglobin: 0.8 % (ref 0.0–1.5)
Methemoglobin: 1.4 % (ref 0.0–1.5)
O2 Saturation: 48.8 %
O2 Saturation: 44.8 %
Total hemoglobin: 19.9 g/dL — ABNORMAL HIGH (ref 12.0–16.0)
Total hemoglobin: 20 g/dL — ABNORMAL HIGH (ref 12.0–16.0)

## 2018-04-27 SURGERY — CARDIOVERSION
Anesthesia: General

## 2018-04-27 MED ORDER — SODIUM CHLORIDE 0.9 % IV SOLN
INTRAVENOUS | Status: DC | PRN
  Administered 2018-04-27: 08:00:00 via INTRAVENOUS

## 2018-04-27 MED ORDER — AMIODARONE HCL IN DEXTROSE 360-4.14 MG/200ML-% IV SOLN
INTRAVENOUS | Status: DC | PRN
  Administered 2018-04-27: 30 mg/h via INTRAVENOUS

## 2018-04-27 MED ORDER — AMIODARONE HCL 200 MG PO TABS
400.0000 mg | ORAL_TABLET | Freq: Two times a day (BID) | ORAL | Status: DC
  Administered 2018-04-27 – 2018-04-28 (×3): 400 mg via ORAL
  Filled 2018-04-27 (×3): qty 2

## 2018-04-27 MED ORDER — PROPOFOL 10 MG/ML IV BOLUS
INTRAVENOUS | Status: DC | PRN
  Administered 2018-04-27: 70 mg via INTRAVENOUS

## 2018-04-27 MED ORDER — LIDOCAINE HCL (CARDIAC) PF 100 MG/5ML IV SOSY
PREFILLED_SYRINGE | INTRAVENOUS | Status: DC | PRN
  Administered 2018-04-27: 70 mg via INTRAVENOUS

## 2018-04-27 MED ORDER — SACUBITRIL-VALSARTAN 24-26 MG PO TABS
1.0000 | ORAL_TABLET | Freq: Two times a day (BID) | ORAL | Status: DC
  Administered 2018-04-27 – 2018-04-28 (×3): 1 via ORAL
  Filled 2018-04-27 (×3): qty 1

## 2018-04-27 MED ORDER — TORSEMIDE 20 MG PO TABS
40.0000 mg | ORAL_TABLET | Freq: Every day | ORAL | Status: DC
  Administered 2018-04-27 – 2018-04-28 (×2): 40 mg via ORAL
  Filled 2018-04-27 (×2): qty 2

## 2018-04-27 NOTE — Progress Notes (Signed)
Physical Therapy Treatment and Discharge Patient Details Name: Randall Jackson MRN: [id number] DOB: 1960/07/17 Today's Date: 04/27/2018    History of Present Illness 58 y.o. male CAD with NSTEMI/BMS x2 to RCA in 2011, post-cath pericardial hermatoma, prior noncompliance, ICM/chronic combined CHF, mitral regurgitation, moderate-severe pulmary HTN, HTN, elevated LFTs, HLD who presented to [facility] with worsening CHF.    PT Comments    Patient seen for mobility progression. Pt is overall mod I/I for mobility and without SOB. HR and SpO2 WNL. Pt is able to safely ascend/descend steps and tolerated gait distance of 500 ft. Given pt's progression PT will sign off at this time. Please re order if needed.   Follow Up Recommendations  No PT follow up;Supervision - Intermittent     Equipment Recommendations  None recommended by PT    Recommendations for Other Services       Precautions / Restrictions      Mobility  Bed Mobility Overal bed mobility: Independent                Transfers Overall transfer level: Independent Equipment used: None                Ambulation/Gait Ambulation/Gait assistance: Independent   Assistive device: None Gait Pattern/deviations: Step-through pattern   Gait velocity interpretation: >2.62 ft/sec, indicative of community ambulatory General Gait Details: no LOB with horizontal head turns, turning, directional changes; SpO2 94% on RA and pt with no c/o SOB   Stairs   Stairs assistance: Modified independent (Device/Increase time) Stair Management: Alternating pattern;Forwards;No rails Number of Stairs: 7 General stair comments: limited by lines   Wheelchair Mobility    Modified Rankin (Stroke Patients Only)       Balance Overall balance assessment: Independent                                          Cognition Arousal/Alertness: Awake/alert Behavior During Therapy: WFL for tasks assessed/performed Overall  Cognitive Status: Within Functional Limits for tasks assessed                                        Exercises      General Comments        Pertinent Vitals/Pain Pain Assessment: No/denies pain    Home Living                      Prior Function            PT Goals (current goals can now be found in the care plan section) Progress towards PT goals: Goals met/education completed, patient discharged from PT    Frequency    Min 3X/week      PT Plan Current plan remains appropriate    Co-evaluation              AM-PAC PT "6 Clicks" Mobility   Outcome Measure  Help needed turning from your back to your side while in a flat bed without using bedrails?: None Help needed moving from lying on your back to sitting on the side of a flat bed without using bedrails?: None Help needed moving to and from a bed to a chair (including a wheelchair)?: None Help needed standing up from a chair using your arms (e.g., wheelchair or bedside chair)?:  None Help needed to walk in hospital room?: None Help needed climbing 3-5 steps with a railing? : A Little 6 Click Score: 23    End of Session Equipment Utilized During Treatment: Gait belt Activity Tolerance: Patient tolerated treatment well Patient left: in bed;with call bell/phone within reach Nurse Communication: Mobility status PT Visit Diagnosis: Muscle weakness (generalized) (M62.81)     Time: 1447-1500 PT Time Calculation (min) (ACUTE ONLY): 13 min  Charges:  $Gait Training: 8-22 mins                     Earney Navy, PTA Acute Rehabilitation Services Pager: [phone] Office: [phone]     Darliss Cheney 04/27/2018, 3:47 PM

## 2018-04-27 NOTE — Anesthesia Postprocedure Evaluation (Signed)
Anesthesia Post Note  Patient: Randall Jackson  Procedure(s) Performed: CARDIOVERSION (N/A )     Patient location during evaluation: Endoscopy Anesthesia Type: General Level of consciousness: awake and alert Pain management: pain level controlled Vital Signs Assessment: post-procedure vital signs reviewed and stable Respiratory status: spontaneous breathing, nonlabored ventilation, respiratory function stable and patient connected to nasal cannula oxygen Cardiovascular status: blood pressure returned to baseline and stable Postop Assessment: no apparent nausea or vomiting Anesthetic complications: no    Last Vitals:  Vitals:   04/27/18 0816 04/27/18 0820  BP: (!) 84/60 115/77  Pulse: 94 95  Resp: (!) 24 12  Temp:    SpO2: 95% 95%    Last Pain:  Vitals:   04/27/18 0816  TempSrc:   PainSc: 0-No pain                 Trevor Iha

## 2018-04-27 NOTE — Progress Notes (Signed)
PT Cancellation Note  Patient Details Name: Bohdan Pizzini MRN: [id number] DOB: [birth year]   Cancelled Treatment:    Reason Eval/Treat Not Completed: Patient at procedure or test/unavailable. Will follow-up for PT treatment as schedule permits.  Ina Homes, PT, DPT Acute Rehabilitation Services  Pager [phone] Office [phone]  Malachy Chamber 04/27/2018, 7:31 AM

## 2018-04-27 NOTE — Progress Notes (Signed)
Patient ID: Videl Nager, male   DOB: [birth year], 58 y.o.   MRN: [id number]     Advanced Heart Failure Rounding Note  PCP-Cardiologist: No primary care provider on file.   Subjective:    Co-ox this morning was 49%, but in setting of atrial flutter with RVR.  CVP 8-9.  He feels fatigued in atrial fibrillation.  Breathing is ok.   He had success DCCV this morning, now back in NSR.   RHC Procedural Findings (3/4): Hemodynamics (mmHg) RA mean 16 RV 50/18 PA 50/27, mean 40 PCWP mean 30 Oxygen saturations: PA 63% AO 95% Cardiac Output (Fick) 3.86  Cardiac Index (Fick) 1.7 PVR 2.6 WU  Echo 04/23/18 EF 20-25%, diffuse HK, RV mod reduced, RA and LA severely dilated, mild to mod MR  Cardiac MRI:  1. Severely dilated LV with EF 15%. Inferior and inferoseptal akinesis, the other walls were hypokinetic. LGE pattern suggested inferior infarct, but EF is lower than would be expected with inferior infarct alone. ?Adverse remodeling versus another process co-existing. 2. The RV was mildly dilated with mild to moderate systolic dysfunction, EF 39%. 3.  Moderate MR.  Objective:   Weight Range: 108.9 kg Body mass index is 32.56 kg/m.   Vital Signs:   Temp:  [97.3 F (36.3 C)-98.1 F (36.7 C)] 97.9 F (36.6 C) (03/06 0709) Pulse Rate:  [61-115] 74 (03/05 2141) Resp:  [20] 20 (03/06 0709) BP: (100-121)/(72-89) 112/72 (03/06 0709) SpO2:  [92 %-98 %] 93 % (03/06 0709) Weight:  [108.9 kg] 108.9 kg (03/06 0709) Last BM Date: 04/26/18  Weight change: Filed Weights   04/26/18 0434 04/27/18 0426 04/27/18 0709  Weight: 109 kg 108.9 kg 108.9 kg    Intake/Output:   Intake/Output Summary (Last 24 hours) at 04/27/2018 0801 Last data filed at 04/27/2018 0437 Gross per 24 hour  Intake 857.68 ml  Output 1325 ml  Net -467.32 ml      Physical Exam    General: NAD Neck: JVP 8 cm, no thyromegaly or thyroid nodule.  Lungs: Clear to auscultation bilaterally with normal respiratory  effort. CV: Lateral PMI.  Heart irregular S1/S2, no S3/S4.  2/6 HSM apex. No peripheral edema.   Abdomen: Soft, nontender, no hepatosplenomegaly, no distention.  Skin: Intact without lesions or rashes.  Neurologic: Alert and oriented x 3.  Psych: Normal affect. Extremities: No clubbing or cyanosis.  HEENT: Normal.    Telemetry   Atrial flutter 80-110s => NSR 70s with DCCV. Personally reviewed.   EKG    No new tracings.   Labs    CBC Recent Labs    04/25/18 0344  04/25/18 0917 04/26/18 0619  WBC 9.6  --   --  10.8*  HGB 18.4*   < > 18.7* 19.0*  HCT 56.3*   < > 55.0* 57.8*  MCV 92.6  --   --  92.6  PLT 245  --   --  229   < > = values in this interval not displayed.   Basic Metabolic Panel Recent Labs    11/13/28 0619 04/27/18 0401  NA 137 136  K 3.7 4.2  CL 98 100  CO2 26 25  GLUCOSE 160* 194*  BUN 18 17  CREATININE 1.08 1.09  CALCIUM 7.9* 8.0*  MG 2.1 2.1   Liver Function Tests No results for input(s): AST, ALT, ALKPHOS, BILITOT, PROT, ALBUMIN in the last 72 hours. No results for input(s): LIPASE, AMYLASE in the last 72 hours. Cardiac Enzymes No results for input(s): CKTOTAL,  CKMB, CKMBINDEX, TROPONINI in the last 72 hours.  BNP: BNP (last 3 results) Recent Labs    04/22/18 1620  BNP 954.3*    ProBNP (last 3 results) No results for input(s): PROBNP in the last 8760 hours.   D-Dimer No results for input(s): DDIMER in the last 72 hours. Hemoglobin A1C No results for input(s): HGBA1C in the last 72 hours. Fasting Lipid Panel No results for input(s): CHOL, HDL, LDLCALC, TRIG, CHOLHDL, LDLDIRECT in the last 72 hours. Thyroid Function Tests Recent Labs    04/24/18 1047  T3FREE 2.6    Other results:   Imaging    Mr Cardiac Morphology W Wo Contrast  Result Date: 04/26/2018 CLINICAL DATA:  Cardiomyopathy EXAM: CARDIAC MRI TECHNIQUE: The patient was scanned on a 1.5 Tesla GE magnet. A dedicated cardiac coil was used. Functional imaging was  done using Fiesta sequences. 2,3, and 4 chamber views were done to assess for RWMA's. Modified Simpson's rule using a short axis stack was used to calculate an ejection fraction on a dedicated work [employer] officer, [employer]. The patient received 8 cc of Gadavist. After 10 minutes inversion recovery sequences were used to assess for infiltration and scar tissue. FINDINGS: Limited images of the lung fields showed no gross abnormalities. Difficult images, free breathing used. Severe dilated left ventricle with normal wall thickness. The basal to mid inferior and inferoseptal walls and the apical inferior wall were akinetic. The remaining wall segments were hypokinetic. The right ventricle was mildly dilated with mild to moderate systolic dysfunction, EF 39%. Severe biatrial enlargement. There is mild-moderate tricuspid regurgitation. There appears to be moderate mitral regurgitation though flow sequences to quantify were not done. On delayed enhancement imaging, there was 76-99% wall thickness subendocardial late gadolinium enhancement (LGE) in the basal to mid inferoseptal and inferior walls. Measurements: LVEDV 383 mL LVSV 56 mL LVEF 15% RVEDV 234 mL RVSV 93 mL RVEF 39% IMPRESSION: 1. Severely dilated LV with EF 15%. Inferior and inferoseptal akinesis, the other walls were hypokinetic. LGE pattern suggested inferior infarct, but EF is lower than would be expected with inferior infarct alone. ?Adverse remodeling versus another process co-existing. 2. The RV was mildly dilated with mild to moderate systolic dysfunction, EF 39%. 3.  Moderate MR. Wannetta Langland Electronically Signed   By: Marca Ancona M.D.   On: 04/26/2018 21:59     Medications:     Scheduled Medications: . amiodarone  400 mg Oral BID  . [MAR Hold] apixaban  5 mg Oral BID  . [MAR Hold] digoxin  0.125 mg Oral Daily  . [MAR Hold] metoprolol succinate  12.5 mg Oral BID  . [MAR Hold] potassium chloride  40 mEq Oral BID  . [MAR Hold]  rosuvastatin  40 mg Oral q1800  . sacubitril-valsartan  1 tablet Oral BID  . [MAR Hold] sodium chloride flush  10-40 mL Intracatheter Q12H  . [MAR Hold] sodium chloride flush  3 mL Intravenous Q12H  . [MAR Hold] sodium chloride flush  3 mL Intravenous Q12H  . [MAR Hold] spironolactone  25 mg Oral Daily  . torsemide  40 mg Oral Daily    Infusions: . [MAR Hold] sodium chloride Stopped (04/23/18 1703)  . [MAR Hold] sodium chloride      PRN Medications: [MAR Hold] sodium chloride, [MAR Hold] sodium chloride, [MAR Hold] acetaminophen, [MAR Hold] hydrocortisone cream, [MAR Hold] ondansetron (ZOFRAN) IV, [MAR Hold] polyethylene glycol, [MAR Hold] sodium chloride flush, [MAR Hold] sodium chloride flush, [MAR Hold] sodium chloride flush  Patient Profile   Adisa Oriley is a 58 y.o. male with a history of CAD s/p RCA stent, chronic systolic HF (EF 25-30%), ICM. MI 2011,  MR, depression, tobacco use, hyperlipidemia, and medications noncompliance.  For several years he did not take CAD meds.   Admitted with A/C systolic HF.   Assessment/Plan   1. Acute on chronic systolic CHF: ?Primarily ischemic cardiomyopathy. Recent cath in 12/19 with CTO RCA, minimal disease in the LAD and LCx.  Echo this admission showed EF 20-25% with moderate dilation and diffuse hypokinesis, moderate MR, mildly decreased RV systolic function.  Echo with diffuse hypokinesis suggests either a mixed cardiomyopathy or substantial negative remodeling post-RCA occlusion. He has never been a heavy drinker.  RHC 3/4, showing that filling pressures are still significantly elevated and CI is low at 1.7.  Cardiac MRI showed severely dilated LV with EF 15%, scar pattern suggests inferior MI but other walls are hypokinetic as well, suggesting a co-existing process or severe negative remodeling; moderate RV dysfunction. Co-ox lower at 49% in setting of atrial flutter with RVR, CVP 8-9.   - Repeat co-ox now that patient is back in  NSR after DCCV.  - Stop losartan, start Entresto 24/26 bid.  - Can start torsemide 40 mg daily today.  - Continue digoxin 0.125, level ok today.  - Continue spironolactone 25 daily.   - Will need repeat echo in 3+ months, if EF remains low, would arrange for ICD (narrow QRS, not CRT candidate).  - Low output on RHC is concerning.  We discussed possible need for advanced therapies.  We will aim to improve him as much as possible with meds, but if needed, he is willing to undergo evaluation for advanced therapies (says he refused consideration earlier as he was overwhelmed with everything going on).  2. CAD: History of inferior MI with RCA in 2011, found to have RCA CTO on cath in 12/19 with minimal disease in LAD and LCx.   - Continue Crestor 40 mg daily.  - No ASA given Eliquis use.  3. NSVT: Keep K and Mg repleted, continue low dose Toprol XL.   - Will not increase Toprol XL with low output, now on amiodarone as well.  - Will likely need Lifevest.   4. Atrial flutter: Noted for the first time this admission, but atria are severely dilated on imaging. DCCV to NSR 3/5.  - Continue Eliquis 5 mg bid.  - Transition amiodarone to po.  5. Noncompliance: He has been reticent to take prescription meds in the past.  We discussed the importance of medication compliance today. I broached the possible need for advanced therapies, wife has been reading about LVAD.   Marca Ancona,  04/27/2018 8:01 AM

## 2018-04-27 NOTE — Procedures (Signed)
Electrical Cardioversion Procedure Note Randall Jackson [id number] 11-07-1960  Procedure: Electrical Cardioversion Indications:  Atrial Flutter  Procedure Details Consent: Risks of procedure as well as the alternatives and risks of each were explained to the (patient/caregiver).  Consent for procedure obtained. Time Out: Verified patient identification, verified procedure, site/side was marked, verified correct patient position, special equipment/implants available, medications/allergies/relevent history reviewed, required imaging and test results available.  Performed  Patient placed on cardiac monitor, pulse oximetry, supplemental oxygen as necessary.  Sedation given: Propofol per anesthesiology Pacer pads placed anterior and posterior chest.  Cardioverted 1 time(s).  Cardioverted at 120J.  Evaluation Findings: Post procedure EKG shows: NSR Complications: None Patient did tolerate procedure well.   Randall Jackson 04/27/2018, 7:57 AM

## 2018-04-27 NOTE — Interval H&P Note (Signed)
History and Physical Interval Note:  04/27/2018 7:51 AM  Randall Jackson  has presented today for surgery, with the diagnosis of a flutter  The various methods of treatment have been discussed with the patient and family. After consideration of risks, benefits and other options for treatment, the patient has consented to  Procedure(s): CARDIOVERSION (N/A) as a surgical intervention .  The patient's history has been reviewed, patient examined, no change in status, stable for surgery.  I have reviewed the patient's chart and labs.  Questions were answered to the patient's satisfaction.     Brannon Levene [employer]

## 2018-04-27 NOTE — Progress Notes (Signed)
Called to room by pt, stated he changed his mind and would be willing to go home on a life vest, CHF PA notified.

## 2018-04-27 NOTE — Progress Notes (Signed)
CARDIAC REHAB PHASE I   PRE:  Rate/Rhythm: 88 SR PACs  BP:  Supine: 96/72  Sitting:   Standing:    SaO2: 96% 2L  94% RA  MODE:  Ambulation: 470 ft   POST:  Rate/Rhythm: 93 SR PACs  BP:  Supine: 92/83  Sitting:   Standing:    SaO2: 94-96%RA 1120-1153 Pt eager to walk. Walked 470 ft on RA with steady gait and denied SOB. Checked sats in hallway and in room on RA. Back to 2L in room as pt using for comfort. In good spirits. Remained in NSR with some PACs.   Luetta Nutting, RN BSN  04/27/2018 11:49 AM

## 2018-04-27 NOTE — Anesthesia Procedure Notes (Signed)
Procedure Name: General with mask airway Date/Time: 04/27/2018 7:53 AM Performed by: Jodell Cipro, CRNA Oxygen Delivery Method: Ambu bag

## 2018-04-27 NOTE — Transfer of Care (Signed)
Immediate Anesthesia Transfer of Care Note  Patient: Randall Jackson  Procedure(s) Performed: CARDIOVERSION (N/A )  Patient Location: Cath Lab  Anesthesia Type:General  Level of Consciousness: awake, alert  and oriented  Airway & Oxygen Therapy: Patient Spontanous Breathing and Patient connected to nasal cannula oxygen  Post-op Assessment: Report given to RN, Post -op Vital signs reviewed and stable and Patient moving all extremities  Post vital signs: Reviewed and stable  Last Vitals:  Vitals Value Taken Time  BP 103/86 04/27/2018  8:09 AM  Temp    Pulse 86 04/27/2018  8:10 AM  Resp 15 04/27/2018  8:10 AM  SpO2 95 % 04/27/2018  8:10 AM  Vitals shown include unvalidated device data.  Last Pain:  Vitals:   04/27/18 0709  TempSrc: Oral  PainSc: 0-No pain         Complications: No apparent anesthesia complications

## 2018-04-28 ENCOUNTER — Encounter (HOSPITAL_COMMUNITY): Payer: Self-pay | Admitting: Cardiology

## 2018-04-28 DIAGNOSIS — E785 Hyperlipidemia, unspecified: Secondary | ICD-10-CM

## 2018-04-28 DIAGNOSIS — I4892 Unspecified atrial flutter: Secondary | ICD-10-CM

## 2018-04-28 DIAGNOSIS — R7989 Other specified abnormal findings of blood chemistry: Secondary | ICD-10-CM

## 2018-04-28 DIAGNOSIS — R739 Hyperglycemia, unspecified: Secondary | ICD-10-CM

## 2018-04-28 DIAGNOSIS — R0683 Snoring: Secondary | ICD-10-CM

## 2018-04-28 LAB — COOXEMETRY PANEL
Carboxyhemoglobin: 1 % (ref 0.5–1.5)
Methemoglobin: 1.4 % (ref 0.0–1.5)
O2 Saturation: 55.1 %
Total hemoglobin: 19.5 g/dL — ABNORMAL HIGH (ref 12.0–16.0)

## 2018-04-28 LAB — BASIC METABOLIC PANEL
ANION GAP: 11 (ref 5–15)
BUN: 18 mg/dL (ref 6–20)
CO2: 25 mmol/L (ref 22–32)
CREATININE: 1.03 mg/dL (ref 0.61–1.24)
Calcium: 8.3 mg/dL — ABNORMAL LOW (ref 8.9–10.3)
Chloride: 101 mmol/L (ref 98–111)
GFR calc Af Amer: >60 mL/min (ref >60)
GFR calc non Af Amer: >60 mL/min (ref >60)
Glucose, Bld: 104 mg/dL — ABNORMAL HIGH (ref 70–99)
Potassium: 4.2 mmol/L (ref 3.5–5.1)
Sodium: 137 mmol/L (ref 135–145)

## 2018-04-28 LAB — MAGNESIUM: Magnesium: 2.1 mg/dL (ref 1.7–2.4)

## 2018-04-28 MED ORDER — POTASSIUM CHLORIDE CRYS ER 20 MEQ PO TBCR: 40.0000 meq | EXTENDED_RELEASE_TABLET | Freq: Every day | ORAL | 3 refills | Status: DC

## 2018-04-28 MED ORDER — SPIRONOLACTONE 25 MG PO TABS: 25.0000 mg | ORAL_TABLET | Freq: Every day | ORAL | 3 refills | Status: DC

## 2018-04-28 MED ORDER — TORSEMIDE 20 MG PO TABS: 40.0000 mg | ORAL_TABLET | Freq: Every day | ORAL | 3 refills | Status: DC

## 2018-04-28 MED ORDER — SACUBITRIL-VALSARTAN 24-26 MG PO TABS: 1.0000 | ORAL_TABLET | Freq: Two times a day (BID) | ORAL | 3 refills | Status: DC

## 2018-04-28 MED ORDER — DIGOXIN 125 MCG PO TABS: 0.1250 mg | ORAL_TABLET | Freq: Every day | ORAL | 3 refills | Status: DC

## 2018-04-28 MED ORDER — METOPROLOL SUCCINATE ER 25 MG PO TB24: 12.5000 mg | ORAL_TABLET | Freq: Two times a day (BID) | ORAL | 3 refills | Status: DC

## 2018-04-28 MED ORDER — APIXABAN 5 MG PO TABS: 5.0000 mg | ORAL_TABLET | Freq: Two times a day (BID) | ORAL | 3 refills | Status: DC

## 2018-04-28 MED ORDER — ROSUVASTATIN CALCIUM 40 MG PO TABS: 40.0000 mg | ORAL_TABLET | Freq: Every evening | ORAL | 3 refills | Status: DC

## 2018-04-28 MED ORDER — AMIODARONE HCL 200 MG PO TABS: ORAL_TABLET | ORAL | 1 refills | Status: DC

## 2018-04-28 NOTE — Care Management Note (Signed)
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  Patient stated that he plans to be compliant with medication because he wants to get better. Lots of emotional support given.  Expected Discharge Date:  04/28/18               Expected Discharge Plan:  Home/Self Care   Post Acute Care Choice:  Durable Medical Equipment  DME Arranged:  Life vest DME Agency:  [facility]  HH Arranged:  NA  Status of Service:  Completed, signed off  Reola Mosher [phone] 04/28/2018, 11:44 AM

## 2018-04-28 NOTE — Discharge Summary (Signed)
Discharge Summary    Patient ID: Randall Jackson,  MRN: [id number], DOB/AGE: 06/15/1960 58 y.o.  Admit date: 04/22/2018 Discharge date: 04/28/2018  Primary Care Provider: Laren Boom Primary Cardiologist: Marca Ancona, MD Primary Electrophysiologist:  None  Discharge Diagnoses    Principal Problem:   Acute on chronic systolic heart failure [facility]) Active Problems:   Cardiomyopathy, ischemic   NSVT (nonsustained ventricular tachycardia) (HCC)   Goals of care, counseling/discussion   Palliative care encounter   Hyperglycemia   Snoring   Abnormal TSH   Hyperlipidemia   Atrial flutter (HCC)   CAD in native artery  Diagnostic Studies/Procedures    DCCV on 3/7.   RHC Procedural Findings (3/4): Hemodynamics (mmHg) RA mean 16 RV 50/18 PA 50/27, mean 40 PCWP mean 30 Oxygen saturations: PA 63% AO 95% Cardiac Output (Fick) 3.86  Cardiac Index (Fick) 1.7 PVR 2.6 WU  2D echo 04/23/18 IMPRESSIONS    1. The left ventricle has severely reduced systolic function, with an ejection fraction of 20-25%. The cavity size was severely dilated. Left ventricular diastolic Doppler parameters are consistent with pseudonormalization Elevated mean left atrial  pressure Left ventricular diffuse hypokinesis.  2. The right ventricle has moderately reduced systolic function. The cavity was normal. There is no increase in right ventricular wall thickness. Right ventricular systolic pressure is moderately elevated with an estimated pressure of 45.7 mmHg.  3. Left atrial size was severely dilated.  4. Right atrial size was severely dilated.  5. The mitral valve is normal in structure. Mitral valve regurgitation is mild to moderate by color flow Doppler.  6. The tricuspid valve is normal in structure.  7. The aortic valve is tricuspid Mild sclerosis of the aortic valve.  8. The pulmonic valve was normal in structure.  9. The inferior vena cava was dilated in size with <50% respiratory  variability.  Cardiac MRI:  1. Severely dilated LV with EF 15%. Inferior and inferoseptal akinesis, the other walls were hypokinetic. LGE pattern suggested inferior infarct, but EF is lower than would be expected with inferior infarct alone. ?Adverse remodeling versus another process co-existing. 2. The RV was mildly dilated with mild to moderate systolic dysfunction, EF 39%. 3. Moderate MR. _____________     History of Present Illness     Randall Jackson is a 58 y.o. male with history of CAD with NSTEMI/BMS x2 to RCA in 2011, cath 01/2018 with 100% distal RCA and 100% CTO and ISR of PLB, prior post-cath pericardial hermatoma 2011, noncompliance, ICM/chronic combined CHF, mitral regurgitation (now mild-moderate), moderate pulmonary HTN, HTN, elevated LFTs, HLD who presented to [facility] with worsening CHF.  After his MI in 2011 he was skeptical of traditional medications and elected to treat his disease with health food and supplements. More recently he was admitted 12/2017 at [facility] with acute on chronic CHF and recurrent drop in EF. Notes outline he had had poor compliance after initial MI 2011, with initially recovered EF but drop to 25-30% during that admission. Cardiac cath there 01/22/18 showed 100% distal RCA,100% CTO and ISR of PLB, 50% distal RCA, EF 20-30%, mild wall irregularities of the LAD/Cx, unsuccessful attempt to cross the PLB CTO. Medical management was recommended. He began developing signs of fluid retention as outpatient and had Lasix called in with minimal relief. He also reported lack of appetite and abdominal discomfort which he originally attributed to the atorvastatin but now may represent low output state. He returned to our hospital, [facility], 04/22/2018 with exertional hypoxia, worsening  orthopnea, DOE, cough, and leg swelling with acute on chronic CHF. He also had a run of NSVT in the ED. He was admitted for further management. On admission his HR was mildly  tachycardic (sinus) and blood pressure was on the lower side, so CHF team was consulted early on to guide management of suspected low output.  During admission, other hospital problems include elevated TSH, A1C 6.5, and newly recognized atrial flutter (not present on admission).  Hospital Course    1. Acute on chronic systolic CHF: ?Primarily ischemic cardiomyopathy. Echo this admission 04/23/18 showed EF 20-25% with moderate dilation and diffuse hypokinesis, moderate MR, and mildly decreased RV systolic function. The echo with diffuse hypokinesis suggested either a mixed cardiomyopathy or substantial negative remodeling post-RCA occlusion. He has never been a heavy drinker. Prior home med list included Toprol, lisinopril (which pt wasn't taking), and spironolactone. He was treated with IV Lasix and his CHF regimen was optimized. His co-ox levels were followed. He underwent RHC 04/25/18 showing that filling pressures were still significantly elevated and CI is low at 1.7. Cardiac MRI showed severely dilated LV with EF 15%, scar pattern suggests inferior MI but other walls are hypokinetic as well, suggesting a co-existing process or severe negative remodeling; moderate RV dysfunction. He has diuresed well with weight significantly down - admitted at 260lb, discharge weight 241lb (109.4kg). He is -10.5L. Co-ox 55% today with CVP 9.  Patient feels good and wants to go home.  New cardiac medicines include Entresto 24/26 bid, torsemide, digoxin, Eliquis, and amiodarone; he will continue spironolactone and Toprol.  - Will need repeat echo in 3 months, if EF remains low, would arrange for ICD (narrow QRS, not CRT candidate). Will defer to the outpatient setting to order. - Low output on RHC is concerning. The CHF team discussed possible need for advanced therapies. They will aim to improve him as much as possible with meds, but if needed, he is willing to undergo evaluation for advanced therapies (says he refused  consideration earlier as he was overwhelmed with everything going on).  - No longer requires O2 on ambulation at discharge.  2. CAD - History of inferior MI with RCA in 2011, found to have RCA CTO on cath in 12/19 with minimal disease in LAD and LCx.  - The patient was switched from Atorvastatin to Crestor 40 mg daily. If the patient is tolerating statin at time of follow-up appointment, would consider rechecking liver function/lipid panel in 6-8 weeks. - Not on ASA now given Eliquis use for #4 (new dx of atrial flutter). - Mild troponin elevation was felt due to demand ischemia.  3. NSVT - noted during admission, electrolytes repleted. Recommend to keep K and Mg normal. He is now on amiodarone for atrial flutter as well as below. - Per Dr. Shirlee Latch, will not increase Toprol XL with low output, now on amiodarone as well.  - Lifevest was arranged at discharge.   4. Atrial flutter: Noted for the first time this admission, but atria are severely dilated on imaging. Underwent DCCV to NSR 3/6.  - Started on amiodarone orally, to go home on 400 mg bid x 5 days then 400 mg daily x 7 days then 200 mg daily. Further monitoring of organ systems while on amiodarone will be at discretion of primary cardiologist. TSH was elevated at 6.202 on arrival and will need repeat at time of follow-up. - Started on Eliquis this admission.  5. HLD - LDL 71. Switching to Crestor as above.  6. Noncompliance - He has been reticent to take prescription meds in the past. Has had discussion of importance of medication compliance today. Dr. Shirlee Latch broached the possible need for advanced therapies, wife has been reading about LVAD.   7. Abnormal TSH - needs OP f/u. Discussed with pt.  8. Hyperglycemia with A1C 6.5 - does not appear to have been further evaluated by IM team prior to transfer to cardiology service. Recommended that patient see PCP as OP upon follow-up to discuss further testing/management. Discussed with pt at  discharge.  9. Snoring - will need OP sleep study arranged as outpatient.  The patient feels much better today. Dr. Shirlee Latch has seen and examined the patient today and feels he is stable for discharge. He feels the patient may return to driving and may return to work in 1 week. Needs TOC f/u in 1 week - I sent message to CHF team to arrange.  _____________  Discharge Vitals Blood pressure (!) 123/96, pulse 80, temperature 98.7 F (37.1 C), temperature source Axillary, resp. rate 19, height 6' (1.829 m), weight 109.4 kg, SpO2 96 %.  Filed Weights   04/27/18 0426 04/27/18 0709 04/28/18 0539  Weight: 108.9 kg 108.9 kg 109.4 kg    Labs & Radiologic Studies    CBC Recent Labs    04/26/18 0619  WBC 10.8*  HGB 19.0*  HCT 57.8*  MCV 92.6  PLT 229   Basic Metabolic Panel Recent Labs    35/36/14 0401 04/28/18 0519  NA 136 137  K 4.2 4.2  CL 100 101  CO2 25 25  GLUCOSE 194* 104*  BUN 17 18  CREATININE 1.09 1.03  CALCIUM 8.0* 8.3*  MG 2.1 2.1   ____________  Dg Chest Port 1 View  Result Date: 04/22/2018 CLINICAL DATA:  Acute shortness of breath and chest pain. EXAM: PORTABLE CHEST 1 VIEW COMPARISON:  None. FINDINGS: Cardiomegaly and pulmonary vascular congestion noted. There is no evidence of focal airspace disease, pulmonary edema, suspicious pulmonary nodule/mass, pleural effusion, or pneumothorax. No acute bony abnormalities are identified. IMPRESSION: Cardiomegaly with pulmonary vascular congestion. Electronically Signed   By: Harmon Pier M.D.   On: 04/22/2018 17:20   Mr Cardiac Morphology W Wo Contrast  Result Date: 04/26/2018 CLINICAL DATA:  Cardiomyopathy EXAM: CARDIAC MRI TECHNIQUE: The patient was scanned on a 1.5 Tesla GE magnet. A dedicated cardiac coil was used. Functional imaging was done using Fiesta sequences. 2,3, and 4 chamber views were done to assess for RWMA's. Modified Simpson's rule using a short axis stack was used to calculate an ejection fraction on a  dedicated work [employer]. The patient received 8 cc of Gadavist. After 10 minutes inversion recovery sequences were used to assess for infiltration and scar tissue. FINDINGS: Limited images of the lung fields showed no gross abnormalities. Difficult images, free breathing used. Severe dilated left ventricle with normal wall thickness. The basal to mid inferior and inferoseptal walls and the apical inferior wall were akinetic. The remaining wall segments were hypokinetic. The right ventricle was mildly dilated with mild to moderate systolic dysfunction, EF 39%. Severe biatrial enlargement. There is mild-moderate tricuspid regurgitation. There appears to be moderate mitral regurgitation though flow sequences to quantify were not done. On delayed enhancement imaging, there was 76-99% wall thickness subendocardial late gadolinium enhancement (LGE) in the basal to mid inferoseptal and inferior walls. Measurements: LVEDV 383 mL LVSV 56 mL LVEF 15% RVEDV 234 mL RVSV 93 mL RVEF 39% IMPRESSION: 1. Severely dilated  LV with EF 15%. Inferior and inferoseptal akinesis, the other walls were hypokinetic. LGE pattern suggested inferior infarct, but EF is lower than would be expected with inferior infarct alone. ?Adverse remodeling versus another process co-existing. 2. The RV was mildly dilated with mild to moderate systolic dysfunction, EF 39%. 3.  Moderate MR. Dalton Mclean Electronically Signed   By: Marca Ancona M.D.   On: 04/26/2018 21:59   [place] Ekg Site Rite  Result Date: 04/25/2018 If Site Rite image not attached, placement could not be confirmed due to current cardiac rhythm.  Disposition   Pt is being discharged home today in good condition.  Follow-up Plans & Appointments    Follow-up Information    Laurey Morale, MD Follow up.   Specialty:  Cardiology Why:  Our office will call you for a follow-up appointment. Please call the office if you have not heard from [place] by Tuesday. Contact  information: [address] [place] [address] [phone]        Laren Boom, DO Follow up.   Specialty:  Family Medicine Why:  Your thyroid function and blood sugar were abnormal this admission - TSH was slightly elevated and A1C was right on the border of being diabetic. Please see your primary care within 1 week of discharge. Contact information: [address] [place] [address] [phone]          Discharge Instructions    Diet - low sodium heart healthy   Complete by:  As directed    Discharge instructions   Complete by:  As directed    Dr. Shirlee Latch feels you can return to driving. He feels you may return to work in 1 week if you are feeling well. Post heart cath instructions: No lifting over 5 lbs for 5 days. No sexual activity for 5 days. Do not engage in activities that exert yourself if you are not feeling well. Keep procedure site clean & dry. If you notice increased pain, swelling, bleeding or pus, call/return!  You may shower, but no soaking baths/hot tubs/pools for 1 week.  See medicine list for instructions on what was stopped. Several of your medicines have been changed to alternatives to optimize your heart failure and coronary disease. You will not be taking aspirin since you are on Eliquis. Some of your supplements were stopped because they can interfere with your heart medicines. Do not take any new supplements in the future without talking to your cardiologist. The topiramate (Topamax) was removed from your list as you indicated you were not taking it.   Increase activity slowly   Complete by:  As directed       Discharge Medications   Allergies as of 04/28/2018      Reactions   Codeine Anaphylaxis, Swelling   Gabapentin Other (See Comments)   Cognitive issues      Medication List    STOP taking these medications   aspirin EC 325 MG tablet   aspirin EC 81 MG tablet   atorvastatin 80 MG tablet Commonly known as:  LIPITOR   carvedilol  12.5 MG tablet Commonly known as:  COREG   lisinopril 5 MG tablet Commonly known as:  PRINIVIL,ZESTRIL   Papaya Enzyme Chew   pravastatin 40 MG tablet Commonly known as:  PRAVACHOL   topiramate 100 MG tablet Commonly known as:  TOPAMAX   vitamin E 1000 UNIT capsule   ZINC PO     TAKE these medications   amiodarone 200 MG tablet  Commonly known as:  PACERONE Take 2 tablets by mouth twice daily for 5 days, then 2 tablets once daily for 7 days, then 1 tablet daily thereafter.   apixaban 5 MG Tabs tablet Commonly known as:  ELIQUIS Take 1 tablet (5 mg total) by mouth 2 (two) times daily.   CoQ10 200 MG Caps Take 200 mg by mouth daily.   digoxin 0.125 MG tablet Commonly known as:  LANOXIN Take 1 tablet (0.125 mg total) by mouth daily.   metoprolol succinate 25 MG 24 hr tablet Commonly known as:  TOPROL-XL Take 0.5 tablets (12.5 mg total) by mouth 2 (two) times daily.   multivitamin with minerals Tabs tablet Take 1 tablet by mouth daily. [employer] Mega Men   potassium chloride SA 20 MEQ tablet Commonly known as:  K-DUR,KLOR-CON Take 2 tablets (40 mEq total) by mouth daily.   pyridoxine 200 MG tablet Commonly known as:  B-6 Take 200 mg by mouth daily.   rosuvastatin 40 MG tablet Commonly known as:  CRESTOR Take 1 tablet (40 mg total) by mouth every evening.   sacubitril-valsartan 24-26 MG Commonly known as:  ENTRESTO Take 1 tablet by mouth 2 (two) times daily.   spironolactone 25 MG tablet Commonly known as:  ALDACTONE Take 1 tablet (25 mg total) by mouth daily.   torsemide 20 MG tablet Commonly known as:  DEMADEX Take 2 tablets (40 mg total) by mouth daily.   vitamin C 1000 MG tablet Take 1,000 mg by mouth daily.            Durable Medical Equipment  (From admission, onward)         Start     Ordered   04/26/18 1414  For home use only DME Vest life vest  Once     04/26/18 1414           Allergies:  Allergies  Allergen Reactions  . Codeine  Anaphylaxis and Swelling  . Gabapentin Other (See Comments)    Cognitive issues    Outstanding Labs/Studies   See above  Duration of Discharge Encounter   Greater than 30 minutes including physician time.  Signed, Tacey Ruiz Crystel Demarco PA-C 04/28/2018, 11:01 AM

## 2018-04-28 NOTE — Social Work (Signed)
CSW received call from bedside RN regarding medication review. Provided RN with RN CM number.   CSW signing off. Please consult if any additional needs arise.  Doy Hutching, LCSWA [facility] Clinical Social Work [phone]

## 2018-04-28 NOTE — Progress Notes (Signed)
Pt is off monitor, has lifevest, eager to d/c. Reviewed low sodium, daily wts, CRPII. Pt voiced understanding although seemed to need the review. Not interested in CRPII at this time but will call if he changes his mind. [id number] Ethelda Chick CES, ACSM 10:44 AM 04/28/2018

## 2018-04-28 NOTE — Progress Notes (Signed)
Notified by CCMD that patient had 9 beats NSVT at 0312. Pt asymptomatic. Will continue to monitor patient.

## 2018-04-28 NOTE — Progress Notes (Signed)
PICC line removed per order. Vaseline gauze and gauze dressing applied, clean, dry, and intact. Patient aware that he is on bedrest until 1110. Patient also aware to leave dressing on and dry for 24 hours.

## 2018-04-28 NOTE — Progress Notes (Signed)
Patient ID: Randall Jackson, male   DOB: 24-Jun-1960, 58 y.o.   MRN: [id number]     Advanced Heart Failure Rounding Note  PCP-Cardiologist: No primary care provider on file.   Subjective:    Co-ox 55% this morning, CVP 9.  Occasional short NSVT still.  He remains in NSR on amiodarone.  Feels good overall.   DCCV on 3/7.   RHC Procedural Findings (3/4): Hemodynamics (mmHg) RA mean 16 RV 50/18 PA 50/27, mean 40 PCWP mean 30 Oxygen saturations: PA 63% AO 95% Cardiac Output (Fick) 3.86  Cardiac Index (Fick) 1.7 PVR 2.6 WU  Echo 04/23/18 EF 20-25%, diffuse HK, RV mod reduced, RA and LA severely dilated, mild to mod MR  Cardiac MRI:  1. Severely dilated LV with EF 15%. Inferior and inferoseptal akinesis, the other walls were hypokinetic. LGE pattern suggested inferior infarct, but EF is lower than would be expected with inferior infarct alone. ?Adverse remodeling versus another process co-existing. 2. The RV was mildly dilated with mild to moderate systolic dysfunction, EF 39%. 3.  Moderate MR.  Objective:   Weight Range: 109.4 kg Body mass index is 32.71 kg/m.   Vital Signs:   Temp:  [97.8 F (36.6 C)-98.7 F (37.1 C)] 98.7 F (37.1 C) (03/07 0539) Pulse Rate:  [80-188] 80 (03/07 0539) Resp:  [19] 19 (03/07 0539) BP: (113-123)/(78-100) 123/96 (03/07 0539) SpO2:  [95 %-96 %] 96 % (03/07 0539) Weight:  [109.4 kg] 109.4 kg (03/07 0539) Last BM Date: 04/27/18  Weight change: Filed Weights   04/27/18 0426 04/27/18 0709 04/28/18 0539  Weight: 108.9 kg 108.9 kg 109.4 kg    Intake/Output:   Intake/Output Summary (Last 24 hours) at 04/28/2018 0844 Last data filed at 04/28/2018 0650 Gross per 24 hour  Intake 880 ml  Output 450 ml  Net 430 ml      Physical Exam    General: NAD Neck: No JVD, no thyromegaly or thyroid nodule.  Lungs: Clear to auscultation bilaterally with normal respiratory effort. CV: Nondisplaced PMI.  Heart regular S1/S2, no S3/S4, no murmur.  No  peripheral edema.  Abdomen: Soft, nontender, no hepatosplenomegaly, no distention.  Skin: Intact without lesions or rashes.  Neurologic: Alert and oriented x 3.  Psych: Normal affect. Extremities: No clubbing or cyanosis.  HEENT: Normal.    Telemetry   NSR 70s, short NSVT run, personally reviewed.   EKG    No new tracings.   Labs    CBC Recent Labs    04/25/18 0917 04/26/18 0619  WBC  --  10.8*  HGB 18.7* 19.0*  HCT 55.0* 57.8*  MCV  --  92.6  PLT  --  229   Basic Metabolic Panel Recent Labs    28/31/51 0401 04/28/18 0519  NA 136 137  K 4.2 4.2  CL 100 101  CO2 25 25  GLUCOSE 194* 104*  BUN 17 18  CREATININE 1.09 1.03  CALCIUM 8.0* 8.3*  MG 2.1 2.1   Liver Function Tests No results for input(s): AST, ALT, ALKPHOS, BILITOT, PROT, ALBUMIN in the last 72 hours. No results for input(s): LIPASE, AMYLASE in the last 72 hours. Cardiac Enzymes No results for input(s): CKTOTAL, CKMB, CKMBINDEX, TROPONINI in the last 72 hours.  BNP: BNP (last 3 results) Recent Labs    04/22/18 1620  BNP 954.3*    ProBNP (last 3 results) No results for input(s): PROBNP in the last 8760 hours.   D-Dimer No results for input(s): DDIMER in the last 72 hours.  Hemoglobin A1C No results for input(s): HGBA1C in the last 72 hours. Fasting Lipid Panel No results for input(s): CHOL, HDL, LDLCALC, TRIG, CHOLHDL, LDLDIRECT in the last 72 hours. Thyroid Function Tests No results for input(s): TSH, T4TOTAL, T3FREE, THYROIDAB in the last 72 hours.  Invalid input(s): FREET3  Other results:   Imaging    No results found.   Medications:     Scheduled Medications: . amiodarone  400 mg Oral BID  . apixaban  5 mg Oral BID  . digoxin  0.125 mg Oral Daily  . metoprolol succinate  12.5 mg Oral BID  . potassium chloride  40 mEq Oral BID  . rosuvastatin  40 mg Oral q1800  . sacubitril-valsartan  1 tablet Oral BID  . sodium chloride flush  10-40 mL Intracatheter Q12H  . sodium  chloride flush  3 mL Intravenous Q12H  . sodium chloride flush  3 mL Intravenous Q12H  . spironolactone  25 mg Oral Daily  . torsemide  40 mg Oral Daily    Infusions: . sodium chloride Stopped (04/23/18 1703)  . sodium chloride      PRN Medications: sodium chloride, sodium chloride, acetaminophen, hydrocortisone cream, ondansetron (ZOFRAN) IV, polyethylene glycol, sodium chloride flush, sodium chloride flush, sodium chloride flush    Patient Profile   Randall Jackson is a 58 y.o. male with a history of CAD s/p RCA stent, chronic systolic HF (EF 25-30%), ICM. MI 2011,  MR, depression, tobacco use, hyperlipidemia, and medications noncompliance.  For several years he did not take CAD meds.   Admitted with A/C systolic HF.   Assessment/Plan   1. Acute on chronic systolic CHF: ?Primarily ischemic cardiomyopathy. Recent cath in 12/19 with CTO RCA, minimal disease in the LAD and LCx.  Echo this admission showed EF 20-25% with moderate dilation and diffuse hypokinesis, moderate MR, mildly decreased RV systolic function.  Echo with diffuse hypokinesis suggests either a mixed cardiomyopathy or substantial negative remodeling post-RCA occlusion. He has never been a heavy drinker.  RHC 3/4, showing that filling pressures are still significantly elevated and CI is low at 1.7.  Cardiac MRI showed severely dilated LV with EF 15%, scar pattern suggests inferior MI but other walls are hypokinetic as well, suggesting a co-existing process or severe negative remodeling; moderate RV dysfunction. He has diuresed well with weight significantly down.  Co-ox 55% today with CVP 9.  Patient feels good and wants to go home.    - Continue Entresto 24/26 bid.  - Continue torsemide 40 daily.  - Continue digoxin 0.125, level ok yesterday.   - Continue spironolactone 25 daily.   - Continue Toprol XL 12.5 mg bid.  - Will need repeat echo in 3 months, if EF remains low, would arrange for ICD (narrow QRS, not CRT  candidate).  - Low output on RHC is concerning.  We discussed possible need for advanced therapies.  We will aim to improve him as much as possible with meds, but if needed, he is willing to undergo evaluation for advanced therapies (says he refused consideration earlier as he was overwhelmed with everything going on).  2. CAD: History of inferior MI with RCA in 2011, found to have RCA CTO on cath in 12/19 with minimal disease in LAD and LCx.   - Continue Crestor 40 mg daily.  - No ASA given Eliquis use.  3. NSVT: Keep K and Mg repleted, continue low dose Toprol XL. He is now on amiodarone.   - Will not increase Toprol  XL with low output, now on amiodarone as well.  - Lifevest at discharge.  4. Atrial flutter: Noted for the first time this admission, but atria are severely dilated on imaging. DCCV to NSR 3/6.  - Continue Eliquis 5 mg bid.  - Continue amiodarone po.  5. Noncompliance: He has been reticent to take prescription meds in the past.  We discussed the importance of medication compliance today. I broached the possible need for advanced therapies, wife has been reading about LVAD.  6. Disposition: Home today with Lifevest.  He will need followup in CHF clinic with me in 1 week.  Stay out of work for 1 week.  Meds for home: amiodarone 400 mg bid x 5 days then 400 mg daily x 7 days then 200 mg daily, torsemide 40 daily, KCl 40 daily, spironolactone 25 daily, Entresto 24/26 bid, Toprol XL 12.5 bid, digoxin 0.125 daily, Crestor 40 daily, Eliquis 5 mg bid.   Marca Ancona,  04/28/2018 8:44 AM

## 2018-04-28 NOTE — Social Work (Signed)
CSW acknowledging consult for pt concerns with affording hospital bill. For assistance pt will have to speak with [facility] billing dept at discharge. CSW unfortunately does not have further access to financial assistance for hospital bills.  CSW signing off. Please consult if any additional needs arise.  Doy Hutching, LCSWA [facility] Clinical Social Work [phone]

## 2018-05-02 ENCOUNTER — Ambulatory Visit: Payer: BLUE CROSS/BLUE SHIELD | Admitting: Cardiology

## 2018-05-04 ENCOUNTER — Other Ambulatory Visit: Payer: Self-pay

## 2018-05-04 ENCOUNTER — Ambulatory Visit (HOSPITAL_COMMUNITY)
Admission: RE | Admit: 2018-05-04 | Discharge: 2018-05-04 | Disposition: A | Discharge status: Home / Self Care | Payer: BLUE CROSS/BLUE SHIELD | Source: Ambulatory Visit | Attending: Cardiology | Admitting: Cardiology

## 2018-05-04 VITALS — BP 102/58 | HR 80 | Wt 248.2 lb

## 2018-05-04 DIAGNOSIS — Z79899 Other long term (current) drug therapy: Secondary | ICD-10-CM | POA: Insufficient documentation

## 2018-05-04 DIAGNOSIS — Z7901 Long term (current) use of anticoagulants: Secondary | ICD-10-CM | POA: Diagnosis not present

## 2018-05-04 DIAGNOSIS — I252 Old myocardial infarction: Secondary | ICD-10-CM | POA: Diagnosis not present

## 2018-05-04 DIAGNOSIS — Z87891 Personal history of nicotine dependence: Secondary | ICD-10-CM | POA: Diagnosis not present

## 2018-05-04 DIAGNOSIS — I5023 Acute on chronic systolic (congestive) heart failure: Secondary | ICD-10-CM | POA: Diagnosis not present

## 2018-05-04 DIAGNOSIS — I2510 Atherosclerotic heart disease of native coronary artery without angina pectoris: Secondary | ICD-10-CM | POA: Diagnosis not present

## 2018-05-04 DIAGNOSIS — I4892 Unspecified atrial flutter: Secondary | ICD-10-CM | POA: Insufficient documentation

## 2018-05-04 LAB — TSH: TSH: 11.244 u[IU]/mL — ABNORMAL HIGH (ref 0.350–4.500)

## 2018-05-04 LAB — COMPREHENSIVE METABOLIC PANEL
ALT: 54 U/L — ABNORMAL HIGH (ref 0–44)
AST: 55 U/L — ABNORMAL HIGH (ref 15–41)
Albumin: 3.5 g/dL (ref 3.5–5.0)
Alkaline Phosphatase: 78 U/L (ref 38–126)
Anion gap: 11 (ref 5–15)
BUN: 13 mg/dL (ref 6–20)
CO2: 20 mmol/L — ABNORMAL LOW (ref 22–32)
Calcium: 8.8 mg/dL — ABNORMAL LOW (ref 8.9–10.3)
Chloride: 105 mmol/L (ref 98–111)
Creatinine, Ser: 1.03 mg/dL (ref 0.61–1.24)
GFR calc Af Amer: >60 mL/min (ref >60)
Glucose, Bld: 140 mg/dL — ABNORMAL HIGH (ref 70–99)
Potassium: 4.6 mmol/L (ref 3.5–5.1)
Sodium: 136 mmol/L (ref 135–145)
Total Bilirubin: 1.3 mg/dL — ABNORMAL HIGH (ref 0.3–1.2)
Total Protein: 6.5 g/dL (ref 6.5–8.1)

## 2018-05-04 LAB — CBC
HCT: 62.7 % — ABNORMAL HIGH (ref 39.0–52.0)
Hemoglobin: 19.7 g/dL — ABNORMAL HIGH (ref 13.0–17.0)
MCH: 29.3 pg (ref 26.0–34.0)
MCHC: 31.4 g/dL (ref 30.0–36.0)
MCV: 93.2 fL (ref 80.0–100.0)
Platelets: 212 10*3/uL (ref 150–400)
RBC: 6.73 MIL/uL — ABNORMAL HIGH (ref 4.22–5.81)
RDW: 15 % (ref 11.5–15.5)
WBC: 8.7 10*3/uL (ref 4.0–10.5)
nRBC: 0 % (ref 0.0–0.2)

## 2018-05-04 LAB — DIGOXIN LEVEL: Digoxin Level: 1.3 ng/mL (ref 0.8–2.0)

## 2018-05-04 MED ORDER — METOPROLOL SUCCINATE ER 25 MG PO TB24: 25.0000 mg | ORAL_TABLET | Freq: Two times a day (BID) | ORAL | 3 refills | Status: DC

## 2018-05-04 MED ORDER — AMIODARONE HCL 200 MG PO TABS: 200.0000 mg | ORAL_TABLET | Freq: Every day | ORAL | 1 refills | Status: DC

## 2018-05-04 NOTE — Patient Instructions (Signed)
Labs were done today. We will call you with any ABNORMAL results. No news is good news!  EKG was completed.   INCREASE Toprol XL to 25 mg TWICE A DAY.  DECREASE Amiodarone to 200 mg daily.  You have been scheduled for a Cardiopulmonary Exercise Test.  Your physician recommends that you schedule a follow-up appointment in: 3 weeks with the Advanced Practice Physician.  Your physician recommends that you schedule a follow-up appointment in: 6 weeks with Dr Shirlee Latch

## 2018-05-06 NOTE — Progress Notes (Signed)
PCP: Laren Boom, DO Cardiology: Dr. Shirlee Latch  58 yo with history of ?mixed ischemic/nonischemic cardiomyopathy, CAD, and paroxysmal atrial flutter presents for followup of recent hospitalization.  Patient had NSTEMI in 2011 with BMS x 2 to RCA. He had minimal medical followup after that time and was not taking medications.  In 12/19, he was admitted to [facility] with dyspnea/chest pain. LHC showed occluded distal RCA with patent LAD and LCx.  He was managed medically.  However, at home, he became progressively dyspneic and was finally admitted to [facility] in 3/20 with CHF exacerbation.  He was diuresed aggressively.  Echo showed EF 20-25%, moderately decreased RV systolic function.  Cardiac MRI showed EF 16% with LGE pattern suggesting prior inferior MI.  RHC showed low cardiac index at 1.7.  Also while in the hospital, he was noted to go into atrial flutter.  Amiodarone was begun for rate control and he had DCCV.    He is doing reasonably well.  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  Wearing Lifevest.  No palpitations.  No chest pain.  Trying to watch Na in diet.  No dyspnea walking on flat ground, has been out to walk his dog.  No orthopnea/PND.  More appetite.    Labs (3/20): K 4.6, creatinine 1.03, AST 55, ALT 54, TSH elevated 11.2, HIV negative, LDL 71.   ECG (3/20, personally reviewed): NSR, nonspecific T wave flattening, anterior Qs, QRS 100 msec  PMH: 1. CAD: NSTEMI with BMS x 2 to RCA in 2011.  - LHC [facility], 12/19): Totally occluded distal RCA. LAD, LCx ok.  2. Depression. 3. Atrial flutter: New diagnosis 3/20, s/p DCCV.  4. Low back pain.  5. Cardiomyopathy: ?Mixed ischemic/nonischemic cardiomyopathy.  Seems out of proportion for only occluded RCA.  HIV negative.  - Echo (3/20): EF 20-25%, RV with moderately decreased systolic function, severe biatrial enlargement, mild-moderate MR.  - Cardiac MRI (3/20): Severe LV dilation, EF 15%, LGE suggestive of inferior MI though  EF lower than would be expected.  - RHC (3/20): mean RA 16, PA 50/27 mean 40, mean PCWP 30, CI 1.7, PVR 2.6 WU.  6. NSVT/PVCs  Social History   Socioeconomic History  . Marital status: Married    Spouse name: Not on file  . Number of children: Not on file  . Years of education: Not on file  . Highest education level: Not on file  Occupational History  . Not on file  Social Needs  . Financial resource strain: Not on file  . Food insecurity:    Worry: Not on file    Inability: Not on file  . Transportation needs:    Medical: Not on file    Non-medical: Not on file  Tobacco Use  . Smoking status: Former Games developer  . Smokeless tobacco: Never Used  Substance and Sexual Activity  . Alcohol use: Not Currently    Alcohol/week: 1.0 standard drinks    Types: 1 Standard drinks or equivalent per week  . Drug use: No  . Sexual activity: Not Currently  Lifestyle  . Physical activity:    Days per week: Not on file    Minutes per session: Not on file  . Stress: Not on file  Relationships  . Social connections:    Talks on phone: Not on file    Gets together: Not on file    Attends religious service: Not on file    Active member of club or organization: Not on file  Attends meetings of clubs or organizations: Not on file    Relationship status: Not on file  . Intimate partner violence:    Fear of current or ex partner: Not on file    Emotionally abused: Not on file    Physically abused: Not on file    Forced sexual activity: Not on file  Other Topics Concern  . Not on file  Social History Narrative  . Not on file   Family History  Problem Relation Age of Onset  . Diabetes Mother        father   ROS: All systems reviewed and negative except as per HPI.   Current Outpatient Medications  Medication Sig Dispense Refill  . amiodarone (PACERONE) 200 MG tablet Take 1 tablet (200 mg total) by mouth daily. 90 tablet 1  . apixaban (ELIQUIS) 5 MG TABS tablet Take 1 tablet (5 mg  total) by mouth 2 (two) times daily. 60 tablet 3  . Ascorbic Acid (VITAMIN C) 1000 MG tablet Take 1,000 mg by mouth daily.    . Coenzyme Q10 (CO Q-10) 200 MG CAPS Take by mouth.    . digoxin (LANOXIN) 0.125 MG tablet Take 1 tablet (0.125 mg total) by mouth daily. 30 tablet 3  . metoprolol succinate (TOPROL-XL) 25 MG 24 hr tablet Take 1 tablet (25 mg total) by mouth 2 (two) times daily. 30 tablet 3  . Multiple Vitamin (MULTIVITAMIN WITH MINERALS) TABS tablet Take 1 tablet by mouth daily. [employer]    . potassium chloride SA (K-DUR,KLOR-CON) 20 MEQ tablet Take 2 tablets (40 mEq total) by mouth daily. 60 tablet 3  . pyridoxine (B-6) 200 MG tablet Take 200 mg by mouth daily.    . rosuvastatin (CRESTOR) 40 MG tablet Take 1 tablet (40 mg total) by mouth every evening. 30 tablet 3  . sacubitril-valsartan (ENTRESTO) 24-26 MG Take 1 tablet by mouth 2 (two) times daily. 60 tablet 3  . spironolactone (ALDACTONE) 25 MG tablet Take 1 tablet (25 mg total) by mouth daily. 30 tablet 3  . torsemide (DEMADEX) 20 MG tablet Take 2 tablets (40 mg total) by mouth daily. 60 tablet 3   No current facility-administered medications for this encounter.    BP (!) 102/58   Pulse 80   Wt 112.6 kg (248 lb 3.2 oz)   SpO2 91%   BMI 33.66 kg/m  General: NAD Neck: No JVD, no thyromegaly or thyroid nodule.  Lungs: Clear to auscultation bilaterally with normal respiratory effort. CV: Nondisplaced PMI.  Heart regular S1/S2, no S3/S4, no murmur.  No peripheral edema.  No carotid bruit.  Normal pedal pulses.  Abdomen: Soft, nontender, no hepatosplenomegaly, no distention.  Skin: Intact without lesions or rashes.  Neurologic: Alert and oriented x 3.  Psych: Normal affect. Extremities: No clubbing or cyanosis.  HEENT: Normal.   1. Chronic systolic CHF:  Cath in 12/19 with CTORCA, minimal disease in the LAD and LCx. Echo 3/20 showed EF 20-25% with moderate dilation and diffuse hypokinesis, moderate MR, mildly decreased RV  systolic function. Echo with diffuse hypokinesis suggests either a mixed cardiomyopathy or substantial negative remodeling post-RCA occlusion. He has never been a heavy drinker. RHC 3/20 showed CI to be low at 1.7.  Cardiac MRI showed severely dilated LV with EF 15%, scar pattern suggested inferior MI but other walls are hypokinetic as well, suggesting a co-existing process or severe negative remodeling; moderate RV dysfunction. Today, NYHA class II-III symptoms.  He is not volume overloaded. Overall,  feels better.  - Continue Entresto 24/26 bid.  - Continue torsemide 40 daily.  - Continue digoxin 0.125, check level today.    - Continue spironolactone 25 daily.   - Increase Toprol XL to 25 mg bid.   - Arrange for CPX after about a month or so.  - He is wearing a Lifevest.  Will need repeat echo in 3 months, if EF remains low, would arrange for ICD (narrow QRS, not CRT candidate).  - Low output on RHC is concerning.  We have discussed possible need for advanced therapies in the future.  We will aim to improve him as much as possible with meds, but if needed, he is willing to undergo evaluation for advanced therapies.  2. CAD: History of inferior MI with RCA in 2011, found to have RCA CTO on cath in 12/19 with minimal disease in LAD and LCx.  - Continue Crestor 40 mg daily, good lipids in 3/20.  - No ASA given Eliquis use.  3. NSVT: He is now on amiodarone and wearing Lifevest.    4. Atrial flutter: Noted for the first time at 3/20 admission, had DCCV, but atria are severely dilated on imaging.  - Continue Eliquis 5 mg bid.  - Continue amiodarone but decrease to 200 mg daily. Check CMET and TSH today.   Followup with APP in 3 wks and me in 6 wks.   Marca Ancona 05/06/2018 9:59 PM

## 2018-05-07 ENCOUNTER — Telehealth (HOSPITAL_COMMUNITY): Payer: Self-pay

## 2018-05-07 NOTE — Telephone Encounter (Signed)
Spoke with patient after he left message with office regarding billing concerns with the life vest.  Pt reports that he receive a letter from insurance company that they denied coverage for vest. Pt would like to send it back.  Advised patient to continue wearing vest.  Per Rosanne Ashing from [employer], pt encouraged to call [employer] billing to discuss.   Pt will call office after he speaks with [employer] for possible appeal.

## 2018-05-10 ENCOUNTER — Telehealth (HOSPITAL_COMMUNITY): Payer: Self-pay | Admitting: *Deleted

## 2018-05-10 NOTE — Telephone Encounter (Signed)
Contacted patient to move up CPX for sooner appointment, per Dr. Shirlee Latch and VAD Coordinator. No answer, left voicemail and will call back later this afternoon or tomorrow in another attempt to reschedule.    Lesia Hausen, MS, ACSM-RCEP Clinical Exercise Physiologist

## 2018-05-14 ENCOUNTER — Telehealth (HOSPITAL_COMMUNITY): Payer: Self-pay

## 2018-05-14 DIAGNOSIS — I5022 Chronic systolic (congestive) heart failure: Secondary | ICD-10-CM

## 2018-05-14 MED ORDER — DIGOXIN 62.5 MCG PO TABS: 0.0625 mg | ORAL_TABLET | Freq: Every day | ORAL | 6 refills | Status: DC

## 2018-05-14 NOTE — Telephone Encounter (Signed)
Pt aware of need for lab work.  Pt scheduled for office visit on 4/2.  Pt will have labs drawn day of appt (if cancelled will come in for lab work). Aware of dose change for digoxin 0.0625mg  daily. Understands not to take dose on date of appointment.   Advised patient to verify with pharmacy to cut pill in half. States understanding of all information.

## 2018-05-14 NOTE — Telephone Encounter (Signed)
-----   Message from Laurey Morale, MD sent at 05/06/2018 10:02 PM EDT ----- 1. Hgb is high.  Would assess for hemochromatosis => send Fe, TIBC, ferritin to start.  2. Repeat TSH and send free T3 and free T4.  3. Decrease digoxin to 0.0625 mg daily. Digoxin level as trough in 10 days.

## 2018-05-24 ENCOUNTER — Telehealth (HOSPITAL_COMMUNITY): Payer: Self-pay

## 2018-05-24 ENCOUNTER — Ambulatory Visit (HOSPITAL_BASED_OUTPATIENT_CLINIC_OR_DEPARTMENT_OTHER)
Admission: RE | Admit: 2018-05-24 | Discharge: 2018-05-24 | Disposition: A | Discharge status: Home / Self Care | Payer: BLUE CROSS/BLUE SHIELD | Source: Ambulatory Visit | Attending: Cardiology | Admitting: Cardiology

## 2018-05-24 ENCOUNTER — Other Ambulatory Visit: Payer: Self-pay

## 2018-05-24 ENCOUNTER — Ambulatory Visit (HOSPITAL_COMMUNITY)
Admission: RE | Admit: 2018-05-24 | Discharge: 2018-05-24 | Disposition: A | Discharge status: Home / Self Care | Payer: BLUE CROSS/BLUE SHIELD | Source: Ambulatory Visit | Attending: Cardiology | Admitting: Cardiology

## 2018-05-24 ENCOUNTER — Other Ambulatory Visit (HOSPITAL_COMMUNITY): Payer: Self-pay

## 2018-05-24 DIAGNOSIS — Z87891 Personal history of nicotine dependence: Secondary | ICD-10-CM | POA: Diagnosis not present

## 2018-05-24 DIAGNOSIS — I5022 Chronic systolic (congestive) heart failure: Secondary | ICD-10-CM

## 2018-05-24 DIAGNOSIS — I472 Ventricular tachycardia: Secondary | ICD-10-CM | POA: Diagnosis not present

## 2018-05-24 DIAGNOSIS — I252 Old myocardial infarction: Secondary | ICD-10-CM | POA: Diagnosis not present

## 2018-05-24 DIAGNOSIS — Z79899 Other long term (current) drug therapy: Secondary | ICD-10-CM | POA: Diagnosis not present

## 2018-05-24 DIAGNOSIS — I2582 Chronic total occlusion of coronary artery: Secondary | ICD-10-CM | POA: Insufficient documentation

## 2018-05-24 DIAGNOSIS — Z87442 Personal history of urinary calculi: Secondary | ICD-10-CM | POA: Diagnosis not present

## 2018-05-24 DIAGNOSIS — Z888 Allergy status to other drugs, medicaments and biological substances status: Secondary | ICD-10-CM | POA: Insufficient documentation

## 2018-05-24 DIAGNOSIS — I4892 Unspecified atrial flutter: Secondary | ICD-10-CM

## 2018-05-24 DIAGNOSIS — I4729 Other ventricular tachycardia: Secondary | ICD-10-CM

## 2018-05-24 DIAGNOSIS — Z7901 Long term (current) use of anticoagulants: Secondary | ICD-10-CM | POA: Insufficient documentation

## 2018-05-24 DIAGNOSIS — I5032 Chronic diastolic (congestive) heart failure: Secondary | ICD-10-CM

## 2018-05-24 DIAGNOSIS — Z955 Presence of coronary angioplasty implant and graft: Secondary | ICD-10-CM | POA: Diagnosis not present

## 2018-05-24 DIAGNOSIS — Z885 Allergy status to narcotic agent status: Secondary | ICD-10-CM | POA: Diagnosis not present

## 2018-05-24 DIAGNOSIS — I428 Other cardiomyopathies: Secondary | ICD-10-CM | POA: Diagnosis not present

## 2018-05-24 DIAGNOSIS — E785 Hyperlipidemia, unspecified: Secondary | ICD-10-CM | POA: Insufficient documentation

## 2018-05-24 DIAGNOSIS — I2510 Atherosclerotic heart disease of native coronary artery without angina pectoris: Secondary | ICD-10-CM | POA: Insufficient documentation

## 2018-05-24 LAB — BASIC METABOLIC PANEL
Anion gap: 8 (ref 5–15)
BUN: 15 mg/dL (ref 6–20)
CO2: 23 mmol/L (ref 22–32)
Calcium: 9.2 mg/dL (ref 8.9–10.3)
Chloride: 106 mmol/L (ref 98–111)
Creatinine, Ser: 0.86 mg/dL (ref 0.61–1.24)
GFR calc Af Amer: >60 mL/min (ref >60)
GFR calc non Af Amer: >60 mL/min (ref >60)
Glucose, Bld: 119 mg/dL — ABNORMAL HIGH (ref 70–99)
Potassium: 4.2 mmol/L (ref 3.5–5.1)
Sodium: 137 mmol/L (ref 135–145)

## 2018-05-24 LAB — DIGOXIN LEVEL: Digoxin Level: <0.2 ng/mL — ABNORMAL LOW (ref 0.8–2.0)

## 2018-05-24 MED ORDER — DIGOXIN 125 MCG PO TABS: 0.1250 mg | ORAL_TABLET | Freq: Every day | ORAL | 5 refills | Status: DC

## 2018-05-24 NOTE — Telephone Encounter (Signed)
Notes recorded by Laurey Morale, MD on 05/24/2018 at 1:01 PM EDT 10 days ------  Notes recorded by Laurey Morale, MD on 05/24/2018 at 12:45 PM EDT Increase to 0.125 daily and get trough level (take the day before but not the morning of blood work). May be able to take the higher dose if we check trough. ------  Notes recorded by Marisa Hua, RN on 05/24/2018 at 12:43 PM EDT Pt reports he has been taking 0.0625mg  daily as instructed. He was reduced on 3/23. He did not take today because of bloodwork. ------  Notes recorded by Laurey Morale, MD on 05/24/2018 at 12:27 PM EDT Low, make sure he is taking.    See above, pt aware of dig level.  Pt reports he has been taking dig as instructed. Pt given new instructions to take 0.125mg  daily.  Pt reports understanding. New Rx sent to pharmacy.  Pt will RTC next week for labs as he could not come in 10 days due to work.  Advised not to take dig morning of labs. Pt appreciative.

## 2018-05-24 NOTE — Addendum Note (Signed)
Encounter addended by: Nicole Cella, RN on: 05/24/2018 4:37 PM  Actions taken: Order list changed, Diagnosis association updated, Clinical Note Signed

## 2018-05-24 NOTE — Patient Instructions (Signed)
Lab work added to you appointment April 9th.  Please follow up with Dr. Shirlee Latch in 3 weeks with a telephone visit.

## 2018-05-24 NOTE — Telephone Encounter (Signed)
-----   Message from Laurey Morale, MD sent at 05/24/2018 12:45 PM EDT ----- Increase to 0.125 daily and get trough level (take the day before but not the morning of blood work).  May be able to take the higher dose if we check trough.

## 2018-05-24 NOTE — Progress Notes (Signed)
Attempted to contact the patient in order to review added lab work and make appointment for telephone visit in 3 weeks. Left voicemail.

## 2018-05-24 NOTE — Progress Notes (Signed)
Heart Failure TeleHealth Note  Due to national recommendations of social distancing due to COVID 19, telehealth visit is felt to be most appropriate for this patient at this time.  I discussed the limitations, risks, security and privacy concerns of performing an evaluation and management service by telephone and the availability of in person appointments. I also discussed with the patient that there may be a patient responsible charge related to this service. The patient expressed understanding and agreed to proceed.   ID:  Randall Jackson, DOB [birth year], MRN [id number]  Location: Home  Provider location: [address] [place] Type of Visit: Established patient  PCP:  Laren Boom, DO  Cardiologist:  Marca Ancona, MD  Chief Complaint: Systolic Heart Failure   History of Present Illness: 58 yo with history of ?mixed ischemic/nonischemic cardiomyopathy, CAD, and paroxysmal atrial flutter presents for followup of recent hospitalization.  Patient had NSTEMI in 2011 with BMS x 2 to RCA. He had minimal medical followup after that time and was not taking medications.    In 12/19, he was admitted to [facility] with dyspnea/chest pain. LHC showed occluded distal RCA with patent LAD and LCx.  He was managed medically.    Admitted to [facility] in 3/20 with CHF exacerbation.  He was diuresed aggressively.  Echo showed EF 20-25%, moderately decreased RV systolic function.  Cardiac MRI showed EF 16% with LGE pattern suggesting prior inferior MI.  RHC showed low cardiac index at 1.7.  Also while in the hospital, he was noted to go into atrial flutter.  Amiodarone was begun for rate control and he had DCCV.    He presents via [employer] educational needs [employer] for a telehealth visit today. Last visit Toprol was increased and amio was decreased. Overall doing fine. 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 Denies SOB or CP with activity. He has been exercising  more and doing yardwork without difficulty. Had an episode of chest tightness 5 days ago. Lasted a few seconds and occurred at rest. Vitals were stable. No edema, orthopnea, or PND. No cough, fever, or chills. No palpitations or dizziness. Appetite decreased due to change in taste buds. Limits salt intake. No longer wearing lifevest because insurance was not paying for it ($4,000). Taking all medications.   BP: 107/86 HR: 60-80s, one episode of reading at 46 Weight: 240 lbs  Pt denies symptoms of cough, fevers, chills, or new SOB worrisome for COVID 19.    Past Medical History:  Diagnosis Date   Atrial flutter (HCC)    a. new dx during 04/2018 admission, converted to NSR via DCCV.   CAD (coronary artery disease)    a. NSTEMI/BMS x2 to RCA in 2011. b. cath 01/2018 with 100% distal RCA and 100% CTO and ISR of PLB   Cardiomyopathy, ischemic 06/05/2013   EF 26% April 2015, Dr. Lehman Prom @ [facility]    Chronic systolic CHF (congestive heart failure) (HCC)    Depression 02/13/2015   Elevated LFTs    H/O right coronary artery stent placement 06/26/2012   March 2015: Dr. Lehman Prom planning on lexiscan and possible cath for pre-op testing for Dr Yetta Barre' back surgery ([facility])    History of kidney stones    Hyperlipidemia    Lumbar radiculopathy 06/26/2012   April 2014: Degenerative changes at L5-S1 with grade 1 spondylolisthesis as a result of bilateral L5 pars interarticularis defects. These changes result in moderate to  advanced bilateral foraminal narrowing with deformity of the exiting bilateral L5 nerve roots..     MI (myocardial infarction) (HCC)    Mitral regurgitation    Noncompliance with medication regimen    NSVT (nonsustained ventricular tachycardia) (HCC)    Pulmonary hypertension (HCC)    PVC (premature ventricular contraction)    Snoring    Tobacco use    Urinary incontinence 06/26/2012   Past Surgical History:  Procedure Laterality Date   cardiac stents      CARDIOVERSION N/A 04/27/2018   Procedure: CARDIOVERSION;  Surgeon: Laurey Morale, MD;  Location: [facility] ENDOSCOPY;  Service: Cardiovascular;  Laterality: N/A;   KNEE SURGERY  1998   RIGHT HEART CATH N/A 04/25/2018   Procedure: RIGHT HEART CATH;  Surgeon: Laurey Morale, MD;  Location: [facility] INVASIVE CV LAB;  Service: Cardiovascular;  Laterality: N/A;   stents placed  2011     Current Outpatient Medications  Medication Sig Dispense Refill   amiodarone (PACERONE) 200 MG tablet Take 1 tablet (200 mg total) by mouth daily. 90 tablet 1   apixaban (ELIQUIS) 5 MG TABS tablet Take 1 tablet (5 mg total) by mouth 2 (two) times daily. 60 tablet 3   Ascorbic Acid (VITAMIN C) 1000 MG tablet Take 1,000 mg by mouth daily.     Coenzyme Q10 (CO Q-10) 200 MG CAPS Take by mouth.     digoxin (LANOXIN) 0.125 MG tablet Take 1 tablet (0.125 mg total) by mouth daily. 30 tablet 5   metoprolol succinate (TOPROL-XL) 25 MG 24 hr tablet Take 1 tablet (25 mg total) by mouth 2 (two) times daily. 30 tablet 3   Multiple Vitamin (MULTIVITAMIN WITH MINERALS) TABS tablet Take 1 tablet by mouth daily. [employer]     potassium chloride SA (K-DUR,KLOR-CON) 20 MEQ tablet Take 2 tablets (40 mEq total) by mouth daily. 60 tablet 3   pyridoxine (B-6) 200 MG tablet Take 200 mg by mouth daily.     rosuvastatin (CRESTOR) 40 MG tablet Take 1 tablet (40 mg total) by mouth every evening. 30 tablet 3   sacubitril-valsartan (ENTRESTO) 24-26 MG Take 1 tablet by mouth 2 (two) times daily. 60 tablet 3   spironolactone (ALDACTONE) 25 MG tablet Take 1 tablet (25 mg total) by mouth daily. 30 tablet 3   torsemide (DEMADEX) 20 MG tablet Take 2 tablets (40 mg total) by mouth daily. 60 tablet 3   No current facility-administered medications for this encounter.     Allergies:   Codeine and Gabapentin   Social History:  The patient  reports that he has quit smoking. He has never used smokeless tobacco. He reports previous alcohol  use of about 1.0 standard drinks of alcohol per week. He reports that he does not use drugs.   Family History:  The patient's family history includes Diabetes in his mother.   ROS:  Please see the history of present illness.   All other systems are personally reviewed and negative.   Exam:  [facility]) Lungs: Normal respiratory effort with conversation.  Neuro: Alert & oriented x 3.   Recent Labs: 04/22/2018: B Natriuretic Peptide 954.3 04/28/2018: Magnesium 2.1 05/04/2018: ALT 54; Hemoglobin 19.7; Platelets 212; TSH 11.244 05/24/2018: BUN 15; Creatinine, Ser 0.86; Potassium 4.2; Sodium 137  Personally reviewed   Wt Readings from Last 3 Encounters:  05/04/18 112.6 kg (248 lb 3.2 oz)  04/28/18 109.4 kg (241 lb 3.2 oz)  09/28/15 116.1 kg (256 lb)  ASSESSMENT AND PLAN:  1. Chronic systolic CHF:  Cath in 12/19 with CTORCA, minimal disease in the LAD and LCx. Echo 3/20 showed EF 20-25% with moderate dilation and diffuse hypokinesis, moderate MR, mildly decreased RV systolic function. Echo with diffuse hypokinesis suggests either a mixed cardiomyopathy or substantial negative remodeling post-RCA occlusion. He has never been a heavy drinker. RHC 3/20 showed CI to be low at 1.7. Cardiac MRI showed severely dilated LV with EF 15%, scar pattern suggested inferior MI but other walls are hypokinetic as well, suggesting a co-existing process or severe negative remodeling; moderate RV dysfunction. NYHA class II-III symptoms.  Volume sounds stable -Continue torsemide 40 daily.Renal function stable today.  - ContinueEntresto 24/26 bid.  - Continue digoxin 0.125 mg daily. Recheck dig level on 4/9. Advised him not to take prior to labs.  - Continue spironolactone 25 daily. - Continue Toprol XL 25 mg bid.  - CPX scheduled for 5/6. Discussed the purpose of this.  - No longer wearing LifeVest and has returned. Says [employer] did not cover it. Will check in with our rep about this. Denies  palpitations. Will need repeat echo in 3 months, if EF remains low, would arrange for ICD (narrow QRS, not CRT candidate).  - Low output on RHC is concerning. We have discussed possible need for advanced therapies in the future. We will aim to improve him as much as possible with meds, but if needed, he is willing to undergo evaluation for advanced therapies. We discussed this again today. He would like to avoid if at all possible.   - Discussed that he will likely be on HF medications lifelong, regardless if EF improves.  - Declines home cardiac rehab.  2. CAD: History of inferior MI with RCA in 2011, found to have RCA CTO on cath in 12/19 with minimal disease in LAD and LCx.  - Continue Crestor 40 mg daily, good lipids in 3/20.  - No ASA given Eliquis use.  - No s/s ischemia. - Offered home cardiac rehab, but he declined. 3. NSVT: Continue amiodarone. He is now refusing LifeVest. 4. Atrial flutter: Noted for the first time at 3/20 admission, had DCCV, but atria are severely dilated on imaging.  - Continue Eliquis 5 mg bid.  -Continue amiodarone 200 mg daily. Will add repeat LFTs to labs on 4/9.   COVID screen The patient does not have any symptoms that suggest any further testing/ screening at this time.  Social distancing reinforced today. He is still working, but is able to wear a mask.   Patient Risk: After full review of this patients clinical status, I feel that they are at high risk for cardiac decompensation at this time.  Follow up in 3 weeks by telephone with Dr Shirlee Latch.  Today, I have spent 24 minutes with the patient with telehealth technology discussing heart failure, CPX testing, advanced therapies, LifeVest, and medications.    Signed, Alford Highland, NP  05/24/2018 2:21 PM  [facility] [address] [facility] [place] [address] [phone] (office) [phone] (fax)

## 2018-05-31 ENCOUNTER — Other Ambulatory Visit (HOSPITAL_COMMUNITY): Payer: Self-pay

## 2018-06-06 ENCOUNTER — Encounter: Payer: Self-pay | Admitting: Physician Assistant

## 2018-06-06 DIAGNOSIS — R7989 Other specified abnormal findings of blood chemistry: Secondary | ICD-10-CM | POA: Insufficient documentation

## 2018-06-07 ENCOUNTER — Encounter: Payer: Self-pay | Admitting: Physician Assistant

## 2018-06-07 ENCOUNTER — Other Ambulatory Visit: Payer: Self-pay

## 2018-06-07 ENCOUNTER — Ambulatory Visit: Payer: Self-pay | Admitting: Physician Assistant

## 2018-06-07 ENCOUNTER — Ambulatory Visit (INDEPENDENT_AMBULATORY_CARE_PROVIDER_SITE_OTHER): Payer: BLUE CROSS/BLUE SHIELD | Admitting: Physician Assistant

## 2018-06-07 VITALS — BP 108/74 | HR 81 | Temp 98.0°F | Wt 256.0 lb

## 2018-06-07 DIAGNOSIS — Z1211 Encounter for screening for malignant neoplasm of colon: Secondary | ICD-10-CM

## 2018-06-07 DIAGNOSIS — R740 Nonspecific elevation of levels of transaminase and lactic acid dehydrogenase [LDH]: Secondary | ICD-10-CM | POA: Diagnosis not present

## 2018-06-07 DIAGNOSIS — Z7901 Long term (current) use of anticoagulants: Secondary | ICD-10-CM

## 2018-06-07 DIAGNOSIS — R7989 Other specified abnormal findings of blood chemistry: Secondary | ICD-10-CM

## 2018-06-07 DIAGNOSIS — Z7689 Persons encountering health services in other specified circumstances: Secondary | ICD-10-CM

## 2018-06-07 DIAGNOSIS — Z1159 Encounter for screening for other viral diseases: Secondary | ICD-10-CM

## 2018-06-07 DIAGNOSIS — E0590 Thyrotoxicosis, unspecified without thyrotoxic crisis or storm: Secondary | ICD-10-CM | POA: Diagnosis not present

## 2018-06-07 DIAGNOSIS — E1169 Type 2 diabetes mellitus with other specified complication: Secondary | ICD-10-CM

## 2018-06-07 DIAGNOSIS — I255 Ischemic cardiomyopathy: Secondary | ICD-10-CM | POA: Diagnosis not present

## 2018-06-07 DIAGNOSIS — E119 Type 2 diabetes mellitus without complications: Secondary | ICD-10-CM | POA: Insufficient documentation

## 2018-06-07 DIAGNOSIS — E039 Hypothyroidism, unspecified: Secondary | ICD-10-CM | POA: Diagnosis not present

## 2018-06-07 DIAGNOSIS — R7401 Elevation of levels of liver transaminase levels: Secondary | ICD-10-CM

## 2018-06-07 NOTE — Patient Instructions (Addendum)
Diabetes Preventive Care: - limit carbs to 45g/meal - annual foot exam  - annual dilated eye exam with an eye doctor - self foot exams at least weekly - pneumonia vaccine once (booster in 5 years and at age 58) - annual influenza vaccine - twice yearly dental cleanings and yearly exam - goal blood pressure <140/90, ideally <130/80 - LDL cholesterol <70 - A1C <1.6 - body mass index (BMI) <25.0 - follow-up every 3 months if your A1C is not at goal - follow-up every 6 months if diabetes is well controlled   Type 2 Diabetes Mellitus, Diagnosis, Adult Type 2 diabetes (type 2 diabetes mellitus) is a long-term (chronic) disease. In type 2 diabetes, one or both of these problems may be present:  The pancreas does not make enough of a hormone called insulin.  Cells in the body do not respond properly to insulin that the body makes (insulin resistance). Normally, insulin allows blood sugar (glucose) to enter cells in the body. The cells use glucose for energy. Insulin resistance or lack of insulin causes excess glucose to build up in the blood instead of going into cells. As a result, high blood glucose (hyperglycemia) develops. What increases the risk? The following factors may make you more likely to develop type 2 diabetes:  Having a family member with type 2 diabetes.  Being overweight or obese.  Having an inactive (sedentary) lifestyle.  Having been diagnosed with insulin resistance.  Having a history of prediabetes, gestational diabetes, or polycystic ovary syndrome (PCOS).  Being of American-Indian, African-American, Hispanic/Latino, or Asian/Pacific Islander descent. What are the signs or symptoms? In the early stage of this condition, you may not have symptoms. Symptoms develop slowly and may include:  Increased thirst (polydipsia).  Increased hunger(polyphagia).  Increased urination (polyuria).  Increased urination during the night (nocturia).  Unexplained weight  loss.  Frequent infections that keep coming back (recurring).  Fatigue.  Weakness.  Vision changes, such as blurry vision.  Cuts or bruises that are slow to heal.  Tingling or numbness in the hands or feet.  Dark patches on the skin (acanthosis nigricans). How is this diagnosed? This condition is diagnosed based on your symptoms, your medical history, a physical exam, and your blood glucose level. Your blood glucose may be checked with one or more of the following blood tests:  A fasting blood glucose (FBG) test. You will not be allowed to eat (you will fast) for 8 hours or longer before a blood sample is taken.  A random blood glucose test. This test checks blood glucose at any time of day regardless of when you ate.  An A1c (hemoglobin A1c) blood test. This test provides information about blood glucose control over the previous 2-3 months.  An oral glucose tolerance test (OGTT). This test measures your blood glucose at two times: ? After fasting. This is your baseline blood glucose level. ? Two hours after drinking a beverage that contains glucose. You may be diagnosed with type 2 diabetes if:  Your FBG level is 126 mg/dL (7.0 mmol/L) or higher.  Your random blood glucose level is 200 mg/dL (10.9 mmol/L) or higher.  Your A1c level is 6.5% or higher.  Your OGTT result is higher than 200 mg/dL (60.4 mmol/L). These blood tests may be repeated to confirm your diagnosis. How is this treated? Your treatment may be managed by a specialist called an endocrinologist. Type 2 diabetes may be treated by following instructions from your health care provider about:  Making diet  and lifestyle changes. This may include: ? Following an individualized nutrition plan that is developed by a diet and nutrition specialist (registered dietitian). ? Exercising regularly. ? Finding ways to manage stress.  Checking your blood glucose level as often as told.  Taking diabetes medicines or  insulin daily. This helps to keep your blood glucose levels in the healthy range. ? If you use insulin, you may need to adjust the dosage depending on how physically active you are and what foods you eat. Your health care provider will tell you how to adjust your dosage.  Taking medicines to help prevent complications from diabetes, such as: ? Aspirin. ? Medicine to lower cholesterol. ? Medicine to control blood pressure. Your health care provider will set individualized treatment goals for you. Your goals will be based on your age, other medical conditions you have, and how you respond to diabetes treatment. Generally, the goal of treatment is to maintain the following blood glucose levels:  Before meals (preprandial): 80-130 mg/dL (1.6-1.04.4-7.2 mmol/L).  After meals (postprandial): below 180 mg/dL (10 mmol/L).  A1c level: less than 7%. Follow these instructions at home: Questions to ask your health care provider  Consider asking the following questions: ? Do I need to meet with a diabetes educator? ? Where can I find a support group for people with diabetes? ? What equipment will I need to manage my diabetes at home? ? What diabetes medicines do I need, and when should I take them? ? How often do I need to check my blood glucose? ? What number can I call if I have questions? ? When is my next appointment? General instructions  Take over-the-counter and prescription medicines only as told by your health care provider.  Keep all follow-up visits as told by your health care provider. This is important.  For more information about diabetes, visit: ? American Diabetes Association (ADA): www.diabetes.org ? American Association of Diabetes Educators (AADE): www.diabeteseducator.org Contact a health care provider if:  Your blood glucose is at or above 240 mg/dL (96.013.3 mmol/L) for 2 days in a row.  You have been sick or have had a fever for 2 days or longer, and you are not getting better.   You have any of the following problems for more than 6 hours: ? You cannot eat or drink. ? You have nausea and vomiting. ? You have diarrhea. Get help right away if:  Your blood glucose is lower than 54 mg/dL (3.0 mmol/L).  You become confused or you have trouble thinking clearly.  You have difficulty breathing.  You have moderate or large ketone levels in your urine. Summary  Type 2 diabetes (type 2 diabetes mellitus) is a long-term (chronic) disease. In type 2 diabetes, the pancreas does not make enough of a hormone called insulin, or cells in the body do not respond properly to insulin that the body makes (insulin resistance).  This condition is treated by making diet and lifestyle changes and taking diabetes medicines or insulin.  Your health care provider will set individualized treatment goals for you. Your goals will be based on your age, other medical conditions you have, and how you respond to diabetes treatment.  Keep all follow-up visits as told by your health care provider. This is important. This information is not intended to replace advice given to you by your health care provider. Make sure you discuss any questions you have with your health care provider. Document Released: 02/07/2005 Document Revised: 09/08/2016 Document Reviewed: 03/13/2015 Elsevier Interactive  Patient Education  2019 [employer] Inc.   Carbohydrate Counting for Diabetes Mellitus, Adult  Carbohydrate counting is a method of keeping track of how many carbohydrates you eat. Eating carbohydrates naturally increases the amount of sugar (glucose) in the blood. Counting how many carbohydrates you eat helps keep your blood glucose within normal limits, which helps you manage your diabetes (diabetes mellitus). It is important to know how many carbohydrates you can safely have in each meal. This is different for every person. A diet and nutrition specialist (registered dietitian) can help you make a meal plan  and calculate how many carbohydrates you should have at each meal and snack. Carbohydrates are found in the following foods:  Grains, such as breads and cereals.  Dried beans and soy products.  Starchy vegetables, such as potatoes, peas, and corn.  Fruit and fruit juices.  Milk and yogurt.  Sweets and snack foods, such as cake, cookies, candy, chips, and soft drinks. How do I count carbohydrates? There are two ways to count carbohydrates in food. You can use either of the methods or a combination of both. Reading "Nutrition Facts" on packaged food The "Nutrition Facts" list is included on the labels of almost all packaged foods and beverages in the [place] It includes:  The serving size.  Information about nutrients in each serving, including the grams (g) of carbohydrate per serving. To use the "Nutrition Facts":  Decide how many servings you will have.  Multiply the number of servings by the number of carbohydrates per serving.  The resulting number is the total amount of carbohydrates that you will be having. Learning standard serving sizes of other foods When you eat carbohydrate foods that are not packaged or do not include "Nutrition Facts" on the label, you need to measure the servings in order to count the amount of carbohydrates:  Measure the foods that you will eat with a food scale or measuring cup, if needed.  Decide how many standard-size servings you will eat.  Multiply the number of servings by 15. Most carbohydrate-rich foods have about 15 g of carbohydrates per serving. ? For example, if you eat 8 oz (170 g) of strawberries, you will have eaten 2 servings and 30 g of carbohydrates (2 servings x 15 g = 30 g).  For foods that have more than one food mixed, such as soups and casseroles, you must count the carbohydrates in each food that is included. The following list contains standard serving sizes of common carbohydrate-rich foods. Each of these servings has about  15 g of carbohydrates:   hamburger bun or  English muffin.   oz (15 mL) syrup.   oz (14 g) jelly.  1 slice of bread.  1 six-inch tortilla.  3 oz (85 g) cooked rice or pasta.  4 oz (113 g) cooked dried beans.  4 oz (113 g) starchy vegetable, such as peas, corn, or potatoes.  4 oz (113 g) hot cereal.  4 oz (113 g) mashed potatoes or  of a large baked potato.  4 oz (113 g) canned or frozen fruit.  4 oz (120 mL) fruit juice.  4-6 crackers.  6 chicken nuggets.  6 oz (170 g) unsweetened dry cereal.  6 oz (170 g) plain fat-free yogurt or yogurt sweetened with artificial sweeteners.  8 oz (240 mL) milk.  8 oz (170 g) fresh fruit or one small piece of fruit.  24 oz (680 g) popped popcorn. Example of carbohydrate counting Sample meal  3 oz (  85 g) chicken breast.  6 oz (170 g) brown rice.  4 oz (113 g) corn.  8 oz (240 mL) milk.  8 oz (170 g) strawberries with sugar-free whipped topping. Carbohydrate calculation 1. Identify the foods that contain carbohydrates: ? Rice. ? Corn. ? Milk. ? Strawberries. 2. Calculate how many servings you have of each food: ? 2 servings rice. ? 1 serving corn. ? 1 serving milk. ? 1 serving strawberries. 3. Multiply each number of servings by 15 g: ? 2 servings rice x 15 g = 30 g. ? 1 serving corn x 15 g = 15 g. ? 1 serving milk x 15 g = 15 g. ? 1 serving strawberries x 15 g = 15 g. 4. Add together all of the amounts to find the total grams of carbohydrates eaten: ? 30 g + 15 g + 15 g + 15 g = 75 g of carbohydrates total. Summary  Carbohydrate counting is a method of keeping track of how many carbohydrates you eat.  Eating carbohydrates naturally increases the amount of sugar (glucose) in the blood.  Counting how many carbohydrates you eat helps keep your blood glucose within normal limits, which helps you manage your diabetes.  A diet and nutrition specialist (registered dietitian) can help you make a meal plan and  calculate how many carbohydrates you should have at each meal and snack. This information is not intended to replace advice given to you by your health care provider. Make sure you discuss any questions you have with your health care provider. Document Released: 02/07/2005 Document Revised: 08/17/2016 Document Reviewed: 07/22/2015 Elsevier Interactive Patient Education  2019 [employer].

## 2018-06-07 NOTE — Progress Notes (Signed)
HPI:                                                                Randall Jackson is a 58 y.o. male who presents to [name]: Primary Care Sports Medicine today to establish care  Current concerns:  [name] is a self-proclaimed "work-aholic." Currently employed by the [employer]  Ischemic cardiomyopathy/A-flutter: followed by Dr. Shirlee Latch, Cardiology. Hx of early CAD age 24 s/p stent. He is taking Digoxin, Entresto, Metoprolol, Spironolactone and Torsemide. He reports he cancelled his recent lab tests because "some lady keeps calling me and messing with my medication and sending me back and forth to the pharmacy. Then they cancelled my appointments so I cancelled them back." Requesting a new referral to Cardiology in [place] to simplify his appointments to one location.  Hx of nephrolithiasis - last episode of renal colic approx 1 year ago. States he manages this by drinking Aloe Vera juice  Prediabetes - states he has been steadily gaining weight. Goal weight is 200-220 lb. Has not weighed this in over 10 years. Current weight 256 lb. Last 5 years weight has ranged from 242-264. States he exercises doing yard work several days per week.  Elevated TSH - denies personal or family hx of thyroid disease. Denies chest pain, heart palpitations, tremor, GI symptoms, or skin changes.   Depression screen PHQ 2/9 06/07/2018  Decreased Interest 0  Down, Depressed, Hopeless 0  PHQ - 2 Score 0      Past Medical History:  Diagnosis Date  . Atrial flutter (HCC)    a. new dx during 04/2018 admission, converted to NSR via DCCV.  Marland Kitchen CAD (coronary artery disease)    a. NSTEMI/BMS x2 to RCA in 2011. b. cath 01/2018 with 100% distal RCA and 100% CTO and ISR of PLB  . Cardiomyopathy, ischemic 06/05/2013   EF 26% April 2015, Dr. Lehman Prom @ [facility]   . Chronic systolic CHF (congestive heart failure) (HCC)   . Depression 02/13/2015  . Diabetes mellitus without  complication (HCC)   . Elevated LFTs   . H/O right coronary artery stent placement 06/26/2012   March 2015: Dr. Lehman Prom planning on lexiscan and possible cath for pre-op testing for Dr Yetta Barre' back surgery [facility])   . History of kidney stones   . Hyperlipidemia   . Hypertension   . Lumbar radiculopathy 06/26/2012   April 2014: Degenerative changes at L5-S1 with grade 1 spondylolisthesis as a result of bilateral L5 pars interarticularis defects. These changes result in moderate to advanced bilateral foraminal narrowing with deformity of the exiting bilateral L5 nerve roots..    . MI (myocardial infarction) (HCC)   . Mitral regurgitation   . Noncompliance with medication regimen   . NSVT (nonsustained ventricular tachycardia) (HCC)   . Pulmonary hypertension (HCC)   . PVC (premature ventricular contraction)   . Snoring   . Subclinical hypothyroidism 06/08/2018  . Thyroid disease   . Tobacco use   . Urinary incontinence 06/26/2012   Past Surgical History:  Procedure Laterality Date  . ANTERIOR CRUCIATE LIGAMENT REPAIR Right   . CARDIOVERSION N/A 04/27/2018   Procedure: CARDIOVERSION;  Surgeon: Laurey Morale, MD;  Location: [facility] ENDOSCOPY;  Service: Cardiovascular;  Laterality:  N/A;  . CORONARY STENT PLACEMENT  2011  . KNEE SURGERY  1998  . RIGHT HEART CATH N/A 04/25/2018   Procedure: RIGHT HEART CATH;  Surgeon: Laurey Morale, MD;  Location: [facility] INVASIVE CV LAB;  Service: Cardiovascular;  Laterality: N/A;   Social History   Tobacco Use  . Smoking status: Former Smoker    Packs/day: 0.50    Years: 14.00    Pack years: 7.00    Types: Cigarettes    Last attempt to quit: 06/02/2017    Years since quitting: 1.0  . Smokeless tobacco: Never Used  Substance Use Topics  . Alcohol use: Not Currently    Comment: rare glass of wine   family history includes Diabetes in his maternal grandmother and mother.    ROS: negative except as noted in the HPI  Medications: Current  Outpatient Medications  Medication Sig Dispense Refill  . amiodarone (PACERONE) 200 MG tablet Take 1 tablet (200 mg total) by mouth daily. 90 tablet 1  . apixaban (ELIQUIS) 5 MG TABS tablet Take 1 tablet (5 mg total) by mouth 2 (two) times daily. 60 tablet 3  . Ascorbic Acid (VITAMIN C) 1000 MG tablet Take 1,000 mg by mouth daily.    . digoxin (LANOXIN) 0.125 MG tablet Take 1 tablet (0.125 mg total) by mouth daily. 30 tablet 5  . metoprolol succinate (TOPROL-XL) 25 MG 24 hr tablet Take 1 tablet (25 mg total) by mouth 2 (two) times daily. 30 tablet 3  . potassium chloride SA (K-DUR,KLOR-CON) 20 MEQ tablet Take 2 tablets (40 mEq total) by mouth daily. 60 tablet 3  . rosuvastatin (CRESTOR) 40 MG tablet Take 1 tablet (40 mg total) by mouth every evening. 30 tablet 3  . sacubitril-valsartan (ENTRESTO) 24-26 MG Take 1 tablet by mouth 2 (two) times daily. 60 tablet 3  . spironolactone (ALDACTONE) 25 MG tablet Take 1 tablet (25 mg total) by mouth daily. 30 tablet 3  . torsemide (DEMADEX) 20 MG tablet Take 2 tablets (40 mg total) by mouth daily. 60 tablet 3  . Coenzyme Q10 (CO Q-10) 200 MG CAPS Take by mouth.    . pyridoxine (B-6) 200 MG tablet Take 200 mg by mouth daily.     No current facility-administered medications for this visit.    Allergies  Allergen Reactions  . Codeine Anaphylaxis and Swelling  . Gabapentin Other (See Comments)    Cognitive issues       Objective:  BP 108/74   Pulse 81   Temp 98 F (36.7 C) (Oral)   Wt 256 lb (116.1 kg)   SpO2 95%   BMI 34.72 kg/m  Gen:  alert, not ill-appearing, wearing mask, no distress, appropriate for age, obese male HEENT: head normocephalic without obvious abnormality, conjunctiva and cornea clear, trachea midline Pulm: Normal work of breathing, normal phonation Neuro: alert and oriented x 3, no tremor MSK: extremities atraumatic, normal gait and station Skin: intact, no rashes on exposed skin, no jaundice, no cyanosis Psych:  well-groomed, cooperative, good eye contact, euthymic mood, affect mood-congruent, speech is articulate, and thought processes clear and goal-directed  BP Readings from Last 3 Encounters:  06/07/18 108/74  05/04/18 (!) 102/58  04/28/18 (!) 123/96   Wt Readings from Last 3 Encounters:  06/07/18 256 lb (116.1 kg)  05/04/18 248 lb 3.2 oz (112.6 kg)  04/28/18 241 lb 3.2 oz (109.4 kg)    Lab Results  Component Value Date   CREATININE 0.86 05/24/2018   BUN 15 05/24/2018  NA 137 05/24/2018   K 4.2 05/24/2018   CL 106 05/24/2018   CO2 23 05/24/2018   Lab Results  Component Value Date   ALT 54 (H) 05/04/2018   AST 55 (H) 05/04/2018   ALKPHOS 78 05/04/2018   BILITOT 1.3 (H) 05/04/2018   Lab Results  Component Value Date   TSH 6.80 (H) 06/07/2018   T4TOTAL 5.4 06/07/2018   Lab Results  Component Value Date   HGBA1C 6.5 (H) 04/22/2018   Lab Results  Component Value Date   CHOL 112 04/22/2018   HDL 23 (L) 04/22/2018   LDLCALC 71 04/22/2018   TRIG 90 04/22/2018   CHOLHDL 4.9 04/22/2018     Assessment and Plan: 58 y.o. male with   .Jarod was seen today for establish care.  Diagnoses and all orders for this visit:  Encounter to establish care  Type 2 diabetes mellitus with other specified complication, without long-term current use of insulin (HCC)  Transaminitis  Increased thyroid stimulating hormone (TSH) level -     Thyroid Panel With TSH -     Thyroid Peroxidase Antibodies (TPO) (REFL)  Encounter for hepatitis C screening test for low risk patient -     Hepatitis C antibody  Colon cancer screening -     Cologuard  Ischemic cardiomyopathy -     Ambulatory referral to Cardiology    - Personally reviewed PMH, PSH, PFH, medications, allergies, HM - Personally reviewed lab results - Age-appropriate cancer screening: overdue for colon cancer screening, discussed screening options, Cologuard ordered - Influenza and Pneumovax declined - Tdap UTD - PHQ2  negative  Type 2 Diabetes  Lab Results  Component Value Date   HGBA1C 6.5 (H) 04/22/2018  patient states he has prediabetes, but A1C is actually in diabetic range Declined medication management Declined referral to diabetes educator stating "I know what I need to eat" Counseled on dietary modification including limiting carbs to 45g/meal On ARB, BP at goal On statin, LDL goal <70  Elevated TSH Repeat thyroid panel + TPO antibody today If TSH >10, will treat subclinical hypothyroidism to prevent heart complications  Patient education and anticipatory guidance given Patient agrees with treatment plan Follow-up in 2 months for diabetes or sooner as needed if symptoms worsen or fail to improve  Levonne Hubert PA-C

## 2018-06-08 ENCOUNTER — Encounter: Payer: Self-pay | Admitting: Physician Assistant

## 2018-06-08 DIAGNOSIS — E038 Other specified hypothyroidism: Secondary | ICD-10-CM

## 2018-06-08 DIAGNOSIS — E039 Hypothyroidism, unspecified: Secondary | ICD-10-CM | POA: Insufficient documentation

## 2018-06-08 HISTORY — DX: Other specified hypothyroidism: E03.8

## 2018-06-08 LAB — THYROID PANEL WITH TSH
Free Thyroxine Index: 1.6 (ref 1.4–3.8)
T3 Uptake: 29 % (ref 22–35)
T4, Total: 5.4 ug/dL (ref 4.9–10.5)
TSH: 6.8 mIU/L — ABNORMAL HIGH (ref 0.40–4.50)

## 2018-06-08 LAB — HEPATITIS C ANTIBODY
Hepatitis C Ab: NONREACTIVE
SIGNAL TO CUT-OFF: 0.05 (ref <1.00)

## 2018-06-08 LAB — THYROID PEROXIDASE ANTIBODIES (TPO) (REFL): Thyroperoxidase Ab SerPl-aCnc: <1 IU/mL (ref <9)

## 2018-06-08 NOTE — Progress Notes (Signed)
Thyroid stimulating hormone is still a little abnormal but other thyroid labs are normal and there is no evidence of autoimmune antibodies attacking the thyroid This is known as subclinical hypothyroidism If TSH gets above 10 it should be treated with thyroid medication to help prevent heart complications For now, we just need to monitor every 6 months

## 2018-06-14 ENCOUNTER — Encounter: Payer: Self-pay | Admitting: Physician Assistant

## 2018-06-14 DIAGNOSIS — Z7901 Long term (current) use of anticoagulants: Secondary | ICD-10-CM | POA: Insufficient documentation

## 2018-06-28 ENCOUNTER — Encounter (HOSPITAL_COMMUNITY): Payer: Self-pay

## 2018-07-10 ENCOUNTER — Telehealth (HOSPITAL_COMMUNITY): Payer: Self-pay | Admitting: Cardiology

## 2018-07-10 NOTE — Telephone Encounter (Signed)
Called patient to reschedule 07/12/2018 appt with Dr. Shirlee Latch to Virtual visit d/t Covid.  Pt stated he has lost his job d/t Covid and financially would prefer to reschedule his appt a couple of months so he can hopefully be back at work.  Pt stated he is not having any issues at this time.  Pt appt was rescheduled to 09/05/2018 per his request.

## 2018-07-12 ENCOUNTER — Encounter (HOSPITAL_COMMUNITY): Payer: Self-pay | Admitting: Cardiology

## 2018-08-02 ENCOUNTER — Ambulatory Visit: Payer: BLUE CROSS/BLUE SHIELD | Admitting: Physician Assistant

## 2018-08-16 ENCOUNTER — Telehealth (HOSPITAL_COMMUNITY): Payer: Self-pay | Admitting: Cardiology

## 2018-08-16 NOTE — Telephone Encounter (Signed)
Left message for patient advising we had rescheduled his 09/05/2018 appt to 08/30/2018 @8 :40 with Dr. Aundra Dubin.  Asked pt to call back to confirm.

## 2018-08-19 ENCOUNTER — Other Ambulatory Visit: Payer: Self-pay | Admitting: Physician Assistant

## 2018-08-30 ENCOUNTER — Encounter (HOSPITAL_COMMUNITY): Payer: BLUE CROSS/BLUE SHIELD | Admitting: Cardiology

## 2018-09-05 ENCOUNTER — Encounter (HOSPITAL_COMMUNITY): Payer: BLUE CROSS/BLUE SHIELD | Admitting: Cardiology

## 2018-09-17 ENCOUNTER — Other Ambulatory Visit (HOSPITAL_COMMUNITY): Payer: Self-pay | Admitting: Cardiology

## 2018-09-17 ENCOUNTER — Other Ambulatory Visit: Payer: Self-pay | Admitting: Physician Assistant

## 2018-09-17 MED ORDER — TORSEMIDE 20 MG PO TABS: 40.0000 mg | ORAL_TABLET | Freq: Every day | ORAL | 0 refills | Status: DC

## 2018-09-21 ENCOUNTER — Other Ambulatory Visit: Payer: Self-pay

## 2018-09-21 ENCOUNTER — Ambulatory Visit (HOSPITAL_COMMUNITY)
Admission: RE | Admit: 2018-09-21 | Discharge: 2018-09-21 | Disposition: A | Discharge status: Home / Self Care | Payer: BLUE CROSS/BLUE SHIELD | Source: Ambulatory Visit | Attending: Cardiology | Admitting: Cardiology

## 2018-09-21 ENCOUNTER — Encounter (HOSPITAL_COMMUNITY): Payer: Self-pay | Admitting: Cardiology

## 2018-09-21 VITALS — BP 105/65 | HR 83 | Wt 280.2 lb

## 2018-09-21 DIAGNOSIS — Z79899 Other long term (current) drug therapy: Secondary | ICD-10-CM | POA: Diagnosis not present

## 2018-09-21 DIAGNOSIS — I2510 Atherosclerotic heart disease of native coronary artery without angina pectoris: Secondary | ICD-10-CM

## 2018-09-21 DIAGNOSIS — I5022 Chronic systolic (congestive) heart failure: Secondary | ICD-10-CM | POA: Diagnosis not present

## 2018-09-21 DIAGNOSIS — I252 Old myocardial infarction: Secondary | ICD-10-CM | POA: Diagnosis not present

## 2018-09-21 DIAGNOSIS — Z87891 Personal history of nicotine dependence: Secondary | ICD-10-CM | POA: Diagnosis not present

## 2018-09-21 DIAGNOSIS — E039 Hypothyroidism, unspecified: Secondary | ICD-10-CM | POA: Insufficient documentation

## 2018-09-21 DIAGNOSIS — I5023 Acute on chronic systolic (congestive) heart failure: Secondary | ICD-10-CM | POA: Insufficient documentation

## 2018-09-21 DIAGNOSIS — Z7901 Long term (current) use of anticoagulants: Secondary | ICD-10-CM | POA: Diagnosis not present

## 2018-09-21 DIAGNOSIS — Z03818 Encounter for observation for suspected exposure to other biological agents ruled out: Secondary | ICD-10-CM | POA: Diagnosis not present

## 2018-09-21 DIAGNOSIS — I4892 Unspecified atrial flutter: Secondary | ICD-10-CM | POA: Insufficient documentation

## 2018-09-21 LAB — COMPREHENSIVE METABOLIC PANEL
ALT: 34 U/L (ref 0–44)
AST: 29 U/L (ref 15–41)
Albumin: 4.3 g/dL (ref 3.5–5.0)
Alkaline Phosphatase: 56 U/L (ref 38–126)
Anion gap: 11 (ref 5–15)
BUN: 16 mg/dL (ref 6–20)
CO2: 22 mmol/L (ref 22–32)
Calcium: 9.4 mg/dL (ref 8.9–10.3)
Chloride: 106 mmol/L (ref 98–111)
Creatinine, Ser: 1.04 mg/dL (ref 0.61–1.24)
GFR calc Af Amer: >60 mL/min (ref >60)
GFR calc non Af Amer: >60 mL/min (ref >60)
Glucose, Bld: 109 mg/dL — ABNORMAL HIGH (ref 70–99)
Potassium: 4.3 mmol/L (ref 3.5–5.1)
Sodium: 139 mmol/L (ref 135–145)
Total Bilirubin: 0.7 mg/dL (ref 0.3–1.2)
Total Protein: 7.2 g/dL (ref 6.5–8.1)

## 2018-09-21 LAB — TSH: TSH: 4.196 u[IU]/mL (ref 0.350–4.500)

## 2018-09-21 MED ORDER — SACUBITRIL-VALSARTAN 49-51 MG PO TABS: 1.0000 | ORAL_TABLET | Freq: Two times a day (BID) | ORAL | 3 refills | Status: AC

## 2018-09-21 MED ORDER — AMIODARONE HCL 200 MG PO TABS: 200.0000 mg | ORAL_TABLET | Freq: Every day | ORAL | 6 refills | Status: DC

## 2018-09-21 MED ORDER — TORSEMIDE 20 MG PO TABS: 40.0000 mg | ORAL_TABLET | Freq: Every day | ORAL | 0 refills | Status: DC

## 2018-09-21 NOTE — Patient Instructions (Signed)
Labs were done today. We will call you with any ABNORMAL results. No news is good news!  Please return to have repeat labs drawn in 2 weeks.  EKG was completed.   You have been referred to complete a Cardiopulmonary Exercise Stress test.  Your physician has requested that you have an echocardiogram. Echocardiography is a painless test that uses sound waves to create images of your heart. It provides your doctor with information about the size and shape of your heart and how well your heart's chambers and valves are working. This procedure takes approximately one hour. There are no restrictions for this procedure.  INCREASE Entresto to 49/51 mg (1 tab) twice a day.   DECREASE Torsemide to 40 mg (2 tabs) one day, alternating with 20 mg (1 tab) the other day.  Your physician recommends that you schedule a follow-up appointment in: 1 month with our APP.  Your physician recommends that you schedule a follow-up appointment in: 2 months with Dr Aundra Dubin.  At the [facility], you and your health needs are our priority. As part of our continuing mission to provide you with exceptional heart care, we have created designated Provider Care Teams. These Care Teams include your primary Cardiologist (physician) and Advanced Practice Providers (APPs- Physician Assistants and Nurse Practitioners) who all work together to provide you with the care you need, when you need it.   You may see any of the following providers on your designated Care Team at your next follow up: Marland Kitchen Dr Glori Bickers . Dr Loralie Champagne . Darrick Grinder, NP   Please be sure to bring in all your medications bottles to every appointment.

## 2018-09-23 NOTE — Progress Notes (Signed)
PCP: Carlis Stable, PA-C Cardiology: Dr. Shirlee Latch  58 y.o. with history of ?mixed ischemic/nonischemic cardiomyopathy, CAD, and paroxysmal atrial flutter presents for followup of CHF and CAD.  Patient had NSTEMI in 2011 with BMS x 2 to RCA. He had minimal medical followup after that time and was not taking medications.  In 12/19, he was admitted to [facility] with dyspnea/chest pain. LHC showed occluded distal RCA with patent LAD and LCx.  He was managed medically.  However, at home, he became progressively dyspneic and was finally admitted to [facility] in 3/20 with CHF exacerbation.  He was diuresed aggressively.  Echo showed EF 20-25%, moderately decreased RV systolic function.  Cardiac MRI showed EF 16% with LGE pattern suggesting prior inferior MI.  RHC showed low cardiac index at 1.7.  Also while in the hospital, he was noted to go into atrial flutter.  Amiodarone was begun for rate control and he had DCCV.    He was out of work for about 3 months and now is back at about 25-30% (works as a [employer] manager).  He stayed at home, exercised minimally (unable to go to the gym), and ate more than normal.  His weight is now up 32 lbs since last appointment.  He has started trying to exercise more now, walks the neighborhood with his dogs.  No significant exertional dyspnea.  He was able to pressure wash his car with no problems.  No orthopnea/PND.  No chest pain.  No lightheadedness.   He is no longer wearing the Lifevest, his insurance would not cover it.   Labs (3/20): K 4.6, creatinine 1.03, AST 55, ALT 54, TSH elevated 11.2, HIV negative, LDL 71.  Labs (4/20): K 4.2, creatinine 0.86, digoxin < 0.3, TSH elevated with normal T4  ECG  (personally reviewed): NSR, slight inferior TWIs  PMH: 1. CAD: NSTEMI with BMS x 2 to RCA in 2011.  - LHC Berton Lan, 12/19): Totally occluded distal RCA. LAD, LCx ok.  2. Depression. 3. Atrial flutter: New diagnosis 3/20, s/p DCCV.  4. Low back pain.  5.  Cardiomyopathy: ?Mixed ischemic/nonischemic cardiomyopathy.  Seems out of proportion for only occluded RCA.  HIV negative.  - Echo (3/20): EF 20-25%, RV with moderately decreased systolic function, severe biatrial enlargement, mild-moderate MR.  - Cardiac MRI (3/20): Severe LV dilation, EF 15%, LGE suggestive of inferior MI though EF lower than would be expected.  - RHC (3/20): mean RA 16, PA 50/27 mean 40, mean PCWP 30, CI 1.7, PVR 2.6 WU.  6. NSVT/PVCs 7. Subclinical hypothyroidism  Social History   Socioeconomic History  . Marital status: Married    Spouse name: Not on file  . Number of children: Not on file  . Years of education: Not on file  . Highest education level: Not on file  Occupational History  . Not on file  Social Needs  . Financial resource strain: Not on file  . Food insecurity    Worry: Not on file    Inability: Not on file  . Transportation needs    Medical: Not on file    Non-medical: Not on file  Tobacco Use  . Smoking status: Former Smoker    Packs/day: 0.50    Years: 14.00    Pack years: 7.00    Types: Cigarettes    Quit date: 06/02/2017    Years since quitting: 1.3  . Smokeless tobacco: Never Used  Substance and Sexual Activity  . Alcohol use: Not Currently    Comment:  rare glass of wine  . Drug use: No  . Sexual activity: Not Currently  Lifestyle  . Physical activity    Days per week: Not on file    Minutes per session: Not on file  . Stress: Not on file  Relationships  . Social Herbalist on phone: Not on file    Gets together: Not on file    Attends religious service: Not on file    Active member of club or organization: Not on file    Attends meetings of clubs or organizations: Not on file    Relationship status: Not on file  . Intimate partner violence    Fear of current or ex partner: Not on file    Emotionally abused: Not on file    Physically abused: Not on file    Forced sexual activity: Not on file  Other Topics  Concern  . Not on file  Social History Narrative  . Not on file   Family History  Problem Relation Age of Onset  . Diabetes Mother        father  . Diabetes Maternal Grandmother    ROS: All systems reviewed and negative except as per HPI.   Current Outpatient Medications  Medication Sig Dispense Refill  . Ascorbic Acid (VITAMIN C) 1000 MG tablet Take 1,000 mg by mouth daily.    . Coenzyme Q10 (CO Q-10) 200 MG CAPS Take by mouth.    . digoxin (LANOXIN) 0.125 MG tablet Take 1 tablet (0.125 mg total) by mouth daily. 30 tablet 5  . ELIQUIS 5 MG TABS tablet TAKE 1 TABLET BY MOUTH 2 TIMES DAILY 60 tablet 6  . metoprolol succinate (TOPROL-XL) 25 MG 24 hr tablet TAKE 1 TABLET(25 MG) BY MOUTH TWICE DAILY 60 tablet 0  . potassium chloride SA (K-DUR) 20 MEQ tablet TAKE 2 TABLETS BY MOUTH DAILY 60 tablet 6  . pyridoxine (B-6) 200 MG tablet Take 200 mg by mouth daily.    . rosuvastatin (CRESTOR) 40 MG tablet TAKE 1 TABLET BY MOUTH EVERY EVENING 30 tablet 6  . spironolactone (ALDACTONE) 25 MG tablet TAKE 1 TABLET BY MOUTH DAILY 30 tablet 6  . torsemide (DEMADEX) 20 MG tablet Take 2 tablets (40 mg total) by mouth daily. Alternate with 1 tablet (20 mg total) every other day. 60 tablet 0  . amiodarone (PACERONE) 200 MG tablet Take 1 tablet (200 mg total) by mouth daily. 30 tablet 6  . sacubitril-valsartan (ENTRESTO) 49-51 MG Take 1 tablet by mouth 2 (two) times daily. 180 tablet 3   No current facility-administered medications for this encounter.    BP 105/65   Pulse 83   Wt 127.1 kg (280 lb 3.2 oz)   SpO2 97%   BMI 38.00 kg/m  General: NAD Neck: No JVD, no thyromegaly or thyroid nodule.  Lungs: Clear to auscultation bilaterally with normal respiratory effort. CV: Nondisplaced PMI.  Heart regular S1/S2, no S3/S4, no murmur.  No peripheral edema.  No carotid bruit.  Normal pedal pulses.  Abdomen: Soft, nontender, no hepatosplenomegaly, no distention.  Skin: Intact without lesions or rashes.   Neurologic: Alert and oriented x 3.  Psych: Normal affect. Extremities: No clubbing or cyanosis.  HEENT: Normal.   1. Chronic systolic CHF:  Cath in 75/64 with CTORCA, minimal disease in the LAD and LCx. Echo 3/20 showed EF 20-25% with moderate dilation and diffuse hypokinesis, moderate MR, mildly decreased RV systolic function. Echo with diffuse hypokinesis suggests either a  mixed cardiomyopathy or substantial negative remodeling post-RCA occlusion. He has never been a heavy drinker. RHC 3/20 showed CI to be low at 1.7.  Cardiac MRI showed severely dilated LV with EF 15%, scar pattern suggested inferior MI but other walls are hypokinetic as well, suggesting a co-existing process or severe negative remodeling; moderate RV dysfunction. Today, NYHA class II symptoms.  He is not volume overloaded. I think that his weight gain is due to poor diet/lack of exercise.  - Increase Entresto to 49/51 bid.  BMET today and again in 10 days.   - Continue torsemide 40 daily.  - Continue digoxin 0.125, check level today.    - Continue spironolactone 25 daily.   - Continue Toprol XL 25 mg bid.   - I will arrange for CPX.  - I will arrange for repeat echo.  If EF remains low, will arrange for ICD (narrow QRS, not CRT candidate).  - Low output on prior RHC is concerning.  We have discussed possible need for advanced therapies in the future.  We will aim to improve him as much as possible with meds, but if needed, he is willing to undergo evaluation for advanced therapies.  2. CAD: History of inferior MI with RCA in 2011, found to have RCA CTO on cath in 12/19 with minimal disease in LAD and LCx.  - Continue Crestor 40 mg daily, good lipids in 3/20.  - No ASA given Eliquis use.  3. NSVT: He is now on amiodarone and wearing Lifevest.    4. Atrial flutter: Noted for the first time at 3/20 admission, had DCCV, but atria are severely dilated on imaging.  - Continue Eliquis 5 mg bid.  - Continue amiodarone 200 mg  daily. Check CMET and TSH today.  He will need a regular eye exam.   Followup with APP in 1 month and me in 2 months.   Randall Jackson 09/23/2018

## 2018-10-05 ENCOUNTER — Ambulatory Visit (HOSPITAL_COMMUNITY): Payer: Self-pay

## 2018-10-16 ENCOUNTER — Telehealth (HOSPITAL_COMMUNITY): Payer: Self-pay | Admitting: Cardiology

## 2018-10-16 NOTE — Telephone Encounter (Signed)
Called and spoke with patient to get CPX scheduled.  Pt states at this time he has quite a few medical bills that he wants to get paid on before scheduling any more appts or testing especially since he is not working much d/t Covid.  He states he is feeling good right now and will call back when he is able to schedule the CPX

## 2018-10-18 ENCOUNTER — Telehealth (HOSPITAL_COMMUNITY): Payer: Self-pay

## 2018-10-18 NOTE — Telephone Encounter (Signed)
Faxed Requested Medical records to [facility] per Burgess Estelle @ [phone].

## 2018-10-28 ENCOUNTER — Other Ambulatory Visit (HOSPITAL_COMMUNITY): Payer: Self-pay | Admitting: Cardiology

## 2018-11-02 ENCOUNTER — Encounter (HOSPITAL_COMMUNITY): Payer: Self-pay

## 2018-11-14 ENCOUNTER — Telehealth (HOSPITAL_COMMUNITY): Payer: Self-pay | Admitting: Cardiology

## 2018-11-14 NOTE — Telephone Encounter (Signed)
Patient called to report he recently had labs done with pcp and testosterone level was low, before starting patient wanted to make sure he could take testosterone with cardiac history and current medications.

## 2018-11-14 NOTE — Telephone Encounter (Signed)
He can take testosterone, but would keep it at lowest dose needed to bring his level up to the normal range.

## 2018-11-15 NOTE — Telephone Encounter (Signed)
Pt aware and voiced understanding 

## 2018-11-23 ENCOUNTER — Encounter (HOSPITAL_COMMUNITY): Payer: Self-pay | Admitting: Cardiology

## 2018-12-04 ENCOUNTER — Other Ambulatory Visit (HOSPITAL_COMMUNITY): Payer: Self-pay | Admitting: Cardiology

## 2019-01-08 ENCOUNTER — Other Ambulatory Visit: Payer: Self-pay | Admitting: Physician Assistant

## 2019-02-12 ENCOUNTER — Other Ambulatory Visit (HOSPITAL_COMMUNITY): Payer: Self-pay

## 2019-02-12 ENCOUNTER — Telehealth (HOSPITAL_COMMUNITY): Payer: Self-pay

## 2019-02-12 MED ORDER — AMIODARONE HCL 200 MG PO TABS: 200.0000 mg | ORAL_TABLET | Freq: Every day | ORAL | 2 refills | Status: DC

## 2019-02-12 MED ORDER — METOPROLOL SUCCINATE ER 25 MG PO TB24: 25.0000 mg | ORAL_TABLET | Freq: Two times a day (BID) | ORAL | 2 refills | Status: DC

## 2019-02-12 NOTE — Telephone Encounter (Signed)
Please contact patient to schedule an appointment. Last ov was 7/31 was suppose to fu in 2 months

## 2019-03-19 DIAGNOSIS — Z885 Allergy status to narcotic agent status: Secondary | ICD-10-CM | POA: Diagnosis not present

## 2019-03-19 DIAGNOSIS — Z87891 Personal history of nicotine dependence: Secondary | ICD-10-CM | POA: Diagnosis not present

## 2019-03-19 DIAGNOSIS — M4317 Spondylolisthesis, lumbosacral region: Secondary | ICD-10-CM | POA: Diagnosis not present

## 2019-03-19 DIAGNOSIS — Z79899 Other long term (current) drug therapy: Secondary | ICD-10-CM | POA: Diagnosis not present

## 2019-03-19 DIAGNOSIS — M542 Cervicalgia: Secondary | ICD-10-CM | POA: Diagnosis not present

## 2019-03-19 DIAGNOSIS — Z888 Allergy status to other drugs, medicaments and biological substances status: Secondary | ICD-10-CM | POA: Diagnosis not present

## 2019-03-19 DIAGNOSIS — R079 Chest pain, unspecified: Secondary | ICD-10-CM | POA: Diagnosis not present

## 2019-03-19 DIAGNOSIS — M5136 Other intervertebral disc degeneration, lumbar region: Secondary | ICD-10-CM | POA: Diagnosis not present

## 2019-03-19 DIAGNOSIS — I509 Heart failure, unspecified: Secondary | ICD-10-CM | POA: Diagnosis not present

## 2019-03-19 DIAGNOSIS — M549 Dorsalgia, unspecified: Secondary | ICD-10-CM | POA: Diagnosis not present

## 2019-03-19 DIAGNOSIS — I252 Old myocardial infarction: Secondary | ICD-10-CM | POA: Diagnosis not present

## 2019-03-19 DIAGNOSIS — I493 Ventricular premature depolarization: Secondary | ICD-10-CM | POA: Diagnosis not present

## 2019-03-19 DIAGNOSIS — I2510 Atherosclerotic heart disease of native coronary artery without angina pectoris: Secondary | ICD-10-CM | POA: Diagnosis not present

## 2019-03-19 DIAGNOSIS — M546 Pain in thoracic spine: Secondary | ICD-10-CM | POA: Diagnosis not present

## 2019-03-19 DIAGNOSIS — M47817 Spondylosis without myelopathy or radiculopathy, lumbosacral region: Secondary | ICD-10-CM | POA: Diagnosis not present

## 2019-03-20 ENCOUNTER — Other Ambulatory Visit: Payer: Self-pay | Admitting: Physician Assistant

## 2019-03-25 DIAGNOSIS — M542 Cervicalgia: Secondary | ICD-10-CM | POA: Diagnosis not present

## 2019-03-25 DIAGNOSIS — M545 Low back pain: Secondary | ICD-10-CM | POA: Diagnosis not present

## 2019-04-18 ENCOUNTER — Other Ambulatory Visit (HOSPITAL_COMMUNITY): Payer: Self-pay

## 2019-04-18 MED ORDER — APIXABAN 5 MG PO TABS: 5.0000 mg | ORAL_TABLET | Freq: Two times a day (BID) | ORAL | 6 refills | Status: AC

## 2019-04-18 MED ORDER — POTASSIUM CHLORIDE CRYS ER 20 MEQ PO TBCR: 40.0000 meq | EXTENDED_RELEASE_TABLET | Freq: Every day | ORAL | 6 refills | Status: AC

## 2019-04-18 MED ORDER — ROSUVASTATIN CALCIUM 40 MG PO TABS: 40.0000 mg | ORAL_TABLET | Freq: Every evening | ORAL | 6 refills | Status: AC

## 2019-04-18 NOTE — Addendum Note (Signed)
Addended by: Marisa Hua on: 04/18/2019 11:53 AM   Modules accepted: Orders

## 2019-04-18 NOTE — Addendum Note (Signed)
Addended by: Marisa Hua on: 04/18/2019 11:50 AM   Modules accepted: Orders

## 2019-04-22 ENCOUNTER — Other Ambulatory Visit (HOSPITAL_COMMUNITY): Payer: Self-pay

## 2019-04-22 ENCOUNTER — Other Ambulatory Visit: Payer: Self-pay | Admitting: Physician Assistant

## 2019-04-22 MED ORDER — SPIRONOLACTONE 25 MG PO TABS: 25.0000 mg | ORAL_TABLET | Freq: Every day | ORAL | 6 refills | Status: AC

## 2019-04-25 ENCOUNTER — Telehealth (HOSPITAL_COMMUNITY): Payer: Self-pay

## 2019-04-25 NOTE — Telephone Encounter (Signed)
Have attempted to contact patient via phone to schedule a follow up appt.  Left voicemail asking patient to contact our office to scheduled a follow up with Dr. Shirlee Latch so we can continue to submit refills for his prescriptions.  If patient returns call, the first available appt with Dr. Shirlee Latch is fine.

## 2019-05-17 DIAGNOSIS — Z23 Encounter for immunization: Secondary | ICD-10-CM | POA: Diagnosis not present

## 2019-05-23 ENCOUNTER — Other Ambulatory Visit (HOSPITAL_COMMUNITY): Payer: Self-pay

## 2019-05-23 MED ORDER — TORSEMIDE 20 MG PO TABS: 40.0000 mg | ORAL_TABLET | Freq: Every day | ORAL | 0 refills | Status: DC

## 2019-05-29 ENCOUNTER — Other Ambulatory Visit (HOSPITAL_COMMUNITY): Payer: Self-pay

## 2019-05-29 MED ORDER — AMIODARONE HCL 200 MG PO TABS: 200.0000 mg | ORAL_TABLET | Freq: Every day | ORAL | 2 refills | Status: AC

## 2019-05-29 MED ORDER — METOPROLOL SUCCINATE ER 25 MG PO TB24: 25.0000 mg | ORAL_TABLET | Freq: Two times a day (BID) | ORAL | 2 refills | Status: AC

## 2019-06-07 DIAGNOSIS — Z23 Encounter for immunization: Secondary | ICD-10-CM | POA: Diagnosis not present

## 2019-06-24 ENCOUNTER — Other Ambulatory Visit (HOSPITAL_COMMUNITY): Payer: Self-pay

## 2019-06-27 DIAGNOSIS — M5417 Radiculopathy, lumbosacral region: Secondary | ICD-10-CM | POA: Diagnosis not present

## 2019-07-01 ENCOUNTER — Other Ambulatory Visit (HOSPITAL_COMMUNITY): Payer: Self-pay | Admitting: Cardiology

## 2019-07-01 ENCOUNTER — Encounter (HOSPITAL_COMMUNITY): Payer: Self-pay | Admitting: Cardiology

## 2019-07-29 ENCOUNTER — Other Ambulatory Visit (HOSPITAL_COMMUNITY): Payer: Self-pay | Admitting: Cardiology

## 2019-08-05 ENCOUNTER — Other Ambulatory Visit (HOSPITAL_COMMUNITY): Payer: Self-pay | Admitting: Cardiology

## 2019-08-08 DIAGNOSIS — M5136 Other intervertebral disc degeneration, lumbar region: Secondary | ICD-10-CM | POA: Diagnosis not present

## 2019-08-08 DIAGNOSIS — M47816 Spondylosis without myelopathy or radiculopathy, lumbar region: Secondary | ICD-10-CM | POA: Diagnosis not present

## 2019-08-08 DIAGNOSIS — M545 Low back pain: Secondary | ICD-10-CM | POA: Diagnosis not present

## 2019-08-08 DIAGNOSIS — M48061 Spinal stenosis, lumbar region without neurogenic claudication: Secondary | ICD-10-CM | POA: Diagnosis not present

## 2019-08-09 ENCOUNTER — Other Ambulatory Visit (HOSPITAL_COMMUNITY): Payer: Self-pay

## 2019-08-09 MED ORDER — DIGOXIN 125 MCG PO TABS: 0.1250 mg | ORAL_TABLET | Freq: Every day | ORAL | 0 refills | Status: AC

## 2019-08-21 DIAGNOSIS — Z5181 Encounter for therapeutic drug level monitoring: Secondary | ICD-10-CM | POA: Diagnosis not present

## 2019-08-21 DIAGNOSIS — I255 Ischemic cardiomyopathy: Secondary | ICD-10-CM | POA: Diagnosis not present

## 2019-08-21 DIAGNOSIS — I2510 Atherosclerotic heart disease of native coronary artery without angina pectoris: Secondary | ICD-10-CM | POA: Diagnosis not present

## 2019-08-21 DIAGNOSIS — I4891 Unspecified atrial fibrillation: Secondary | ICD-10-CM | POA: Diagnosis not present

## 2019-08-27 DIAGNOSIS — I255 Ischemic cardiomyopathy: Secondary | ICD-10-CM | POA: Diagnosis not present

## 2019-08-27 DIAGNOSIS — Z5181 Encounter for therapeutic drug level monitoring: Secondary | ICD-10-CM | POA: Diagnosis not present

## 2019-08-27 DIAGNOSIS — I4892 Unspecified atrial flutter: Secondary | ICD-10-CM | POA: Diagnosis not present

## 2019-08-27 DIAGNOSIS — I4891 Unspecified atrial fibrillation: Secondary | ICD-10-CM | POA: Diagnosis not present

## 2019-08-27 DIAGNOSIS — Z79899 Other long term (current) drug therapy: Secondary | ICD-10-CM | POA: Diagnosis not present

## 2019-08-27 DIAGNOSIS — R7309 Other abnormal glucose: Secondary | ICD-10-CM | POA: Diagnosis not present

## 2019-09-25 DIAGNOSIS — R05 Cough: Secondary | ICD-10-CM | POA: Diagnosis not present

## 2019-09-25 DIAGNOSIS — I2510 Atherosclerotic heart disease of native coronary artery without angina pectoris: Secondary | ICD-10-CM | POA: Diagnosis not present

## 2019-09-25 DIAGNOSIS — Z888 Allergy status to other drugs, medicaments and biological substances status: Secondary | ICD-10-CM | POA: Diagnosis not present

## 2019-09-25 DIAGNOSIS — R079 Chest pain, unspecified: Secondary | ICD-10-CM | POA: Diagnosis not present

## 2019-09-25 DIAGNOSIS — J209 Acute bronchitis, unspecified: Secondary | ICD-10-CM | POA: Diagnosis not present

## 2019-09-25 DIAGNOSIS — Z87891 Personal history of nicotine dependence: Secondary | ICD-10-CM | POA: Diagnosis not present

## 2019-09-25 DIAGNOSIS — J208 Acute bronchitis due to other specified organisms: Secondary | ICD-10-CM | POA: Diagnosis not present

## 2019-09-25 DIAGNOSIS — Z20822 Contact with and (suspected) exposure to covid-19: Secondary | ICD-10-CM | POA: Diagnosis not present

## 2019-09-25 DIAGNOSIS — Z79899 Other long term (current) drug therapy: Secondary | ICD-10-CM | POA: Diagnosis not present

## 2019-09-25 DIAGNOSIS — Z7901 Long term (current) use of anticoagulants: Secondary | ICD-10-CM | POA: Diagnosis not present

## 2019-09-25 DIAGNOSIS — J3489 Other specified disorders of nose and nasal sinuses: Secondary | ICD-10-CM | POA: Diagnosis not present

## 2019-09-25 DIAGNOSIS — I509 Heart failure, unspecified: Secondary | ICD-10-CM | POA: Diagnosis not present

## 2019-10-27 ENCOUNTER — Other Ambulatory Visit (HOSPITAL_COMMUNITY): Payer: Self-pay | Admitting: Cardiology

## 2020-01-04 IMAGING — MR MR CARD MORPHOLOGY WO/W CM
16 of 18 series · 38 of 40 positions shown · IV contrast (Gadavist)
Comparison: none

CLINICAL DATA: Cardiomyopathy

EXAM:
CARDIAC MRI
TECHNIQUE: The patient was scanned on a 1.5 Tesla GE magnet. A dedicated
cardiac coil was used. Functional imaging was done using [employer]
sequences. [DATE], and 4 chamber views were done to assess for RWMA's.
Modified Senda rule using a short axis stack was used to
calculate an ejection fraction on a dedicated work station using
[employer] software. The patient received 8 cc of Gadavist. After 10
minutes inversion recovery sequences were used to assess for
infiltration and scar tissue.

[Series 6: bSSFP · oblique · 8.0mm · 1.61mm/px · 18 of 400 slices shown]
[im 1/400]
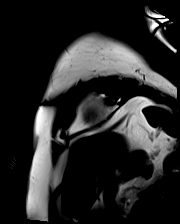
[im 24/400]
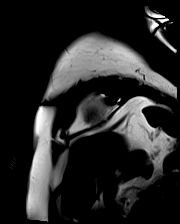
[im 47/400]
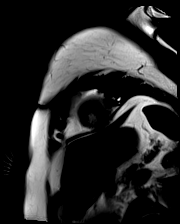
[im 71/400]
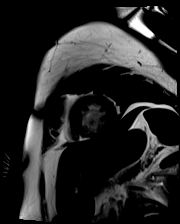
[im 94/400]
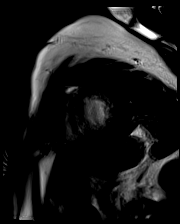
[im 118/400]
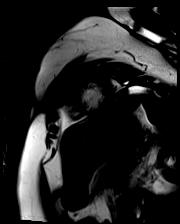
[im 141/400]
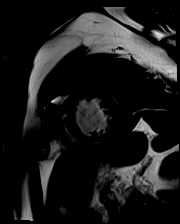
[im 165/400]
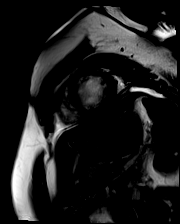
[im 188/400]
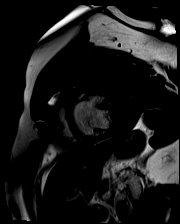
[im 212/400]
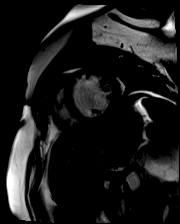
[im 235/400]
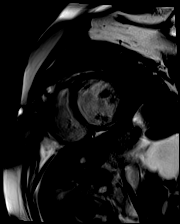
[im 259/400]
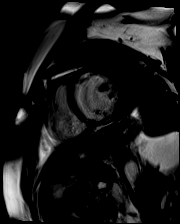
[im 282/400]
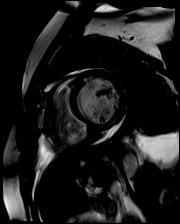
[im 306/400]
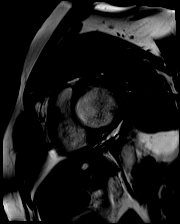
[im 329/400]
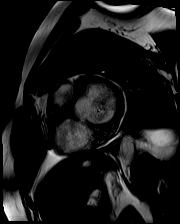
[im 353/400]
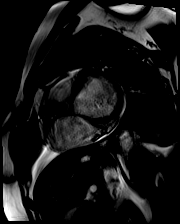
[im 376/400]
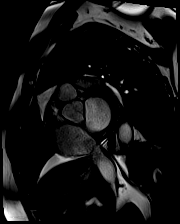
[im 400/400]
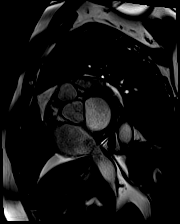

[Series 7: cine_trufi_cs_rt_short axis · oblique · 8.0mm · 1.73mm/px · 6 of 176 slices shown]
[im 1/176]
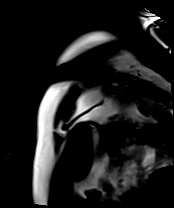
[im 36/176]
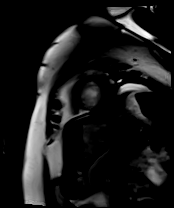
[im 71/176]
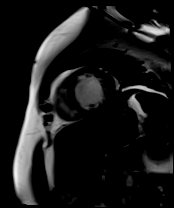
[im 106/176]
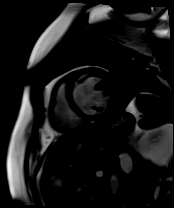
[im 141/176]
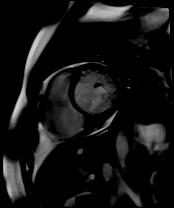
[im 176/176]
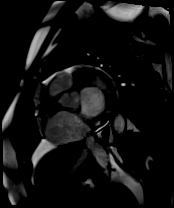

[Series 8: t2_stir_db_sax · oblique · 8.0mm · 1.73mm/px · 1 of 14 slices shown]
[im 1/14]
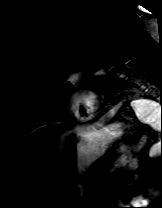

[Series 9: cine_trufi_cs_rt_radial · oblique · 6.0mm · 1.73mm/px · 1 of 9 slices shown (1 of 3)]
[im 1/9]
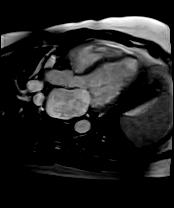

[Series 10: cine_trufi_cs_rt_radial · axial · 6.0mm · 1.73mm/px · 1 of 9 slices shown (2 of 3)]
[im 1/9]
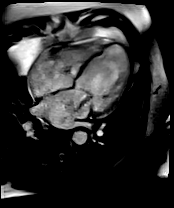

[Series 11: cine_trufi_cs_rt_radial · oblique · 6.0mm · 1.73mm/px · 1 of 9 slices shown (3 of 3)]
[im 1/9]
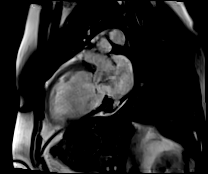

[Series 15: lge short axis_mag · oblique · 8.0mm · 1.50mm/px · 1 of 4 slices shown]
[im 1/4]
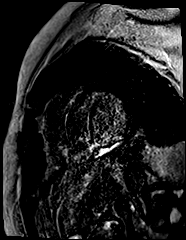

[Series 18: lge_single shot sa · oblique · 8.0mm · 1.98mm/px · 1 of 14 slices shown (1 of 3)]
[im 1/14]
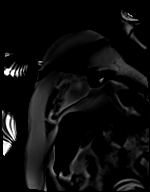

[Series 21: lge_single shot radial_mag · axial · 6.0mm · 1.98mm/px · 1 of 1 slices shown]
[im 1/1]
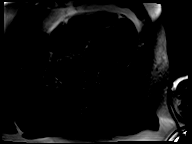

[Series 22: lge_single shot radial_psir · axial · 6.0mm · 1.98mm/px · 1 of 1 slices shown]
[im 1/1]
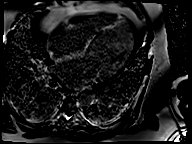

[Series 25: lge_single shot sa · oblique · 8.0mm · 1.98mm/px · 1 of 20 slices shown (2 of 3)]
[im 1/20]
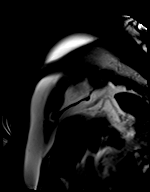

[Series 26: lge_single shot sa · oblique · 8.0mm · 1.98mm/px · 1 of 20 slices shown (3 of 3)]
[im 1/20]
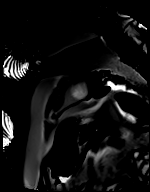

[Series 29: lge radial ((date)ch)_mag · axial · 6.0mm · 1.61mm/px · 1 of 1 slices shown (1 of 2)]
[im 1/1]
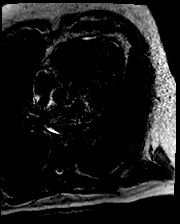

[Series 30: lge radial ((date)ch)_psir · axial · 6.0mm · 1.61mm/px · 1 of 1 slices shown (1 of 2)]
[im 1/1]
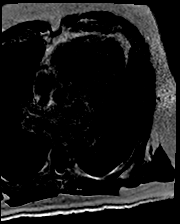

[Series 33: lge radial ((date)ch)_mag · axial · 6.0mm · 1.61mm/px · 1 of 1 slices shown (2 of 2)]
[im 1/1]
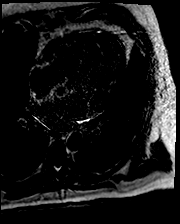

[Series 34: lge radial ((date)ch)_psir · axial · 6.0mm · 1.61mm/px · 1 of 1 slices shown (2 of 2)]
[im 1/1]
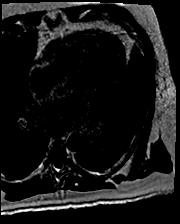

[38 of 40 positions shown; findings below may reference images not displayed]

FINDINGS: Limited images of the lung fields showed no gross abnormalities.

Difficult images, free breathing used.

Severe dilated left ventricle with normal wall thickness. The basal
to mid inferior and inferoseptal walls and the apical inferior wall
were akinetic. The remaining wall segments were hypokinetic. The
right ventricle was mildly dilated with mild to moderate systolic
dysfunction, EF 39%. Severe biatrial enlargement. There is
mild-moderate tricuspid regurgitation. There appears to be moderate
mitral regurgitation though flow sequences to quantify were not
done.

On delayed enhancement imaging, there was 76-99% wall thickness
subendocardial late gadolinium enhancement (LGE) in the basal to mid
inferoseptal and inferior walls.

Measurements:

LVEDV 383 mL

LVSV 56 mL
LVEF 15%

RVEDV 234 mL

RVSV 93 mL

RVEF 39%
IMPRESSION: 1. Severely dilated LV with EF 15%. Inferior and inferoseptal
akinesis, the other walls were hypokinetic. LGE pattern suggested
inferior infarct, but EF is lower than would be expected with
inferior infarct alone. ?Adverse remodeling versus another process
co-existing.

2. The RV was mildly dilated with mild to moderate systolic
dysfunction, EF 39%.

3.  Moderate MR.

Catrachita Biehl

## 2020-02-04 DIAGNOSIS — F32A Depression, unspecified: Secondary | ICD-10-CM | POA: Diagnosis not present

## 2020-02-04 DIAGNOSIS — Z888 Allergy status to other drugs, medicaments and biological substances status: Secondary | ICD-10-CM | POA: Diagnosis not present

## 2020-02-04 DIAGNOSIS — Z79899 Other long term (current) drug therapy: Secondary | ICD-10-CM | POA: Diagnosis not present

## 2020-02-04 DIAGNOSIS — I252 Old myocardial infarction: Secondary | ICD-10-CM | POA: Diagnosis not present

## 2020-02-04 DIAGNOSIS — I2510 Atherosclerotic heart disease of native coronary artery without angina pectoris: Secondary | ICD-10-CM | POA: Diagnosis not present

## 2020-02-04 DIAGNOSIS — F419 Anxiety disorder, unspecified: Secondary | ICD-10-CM | POA: Diagnosis not present

## 2020-02-04 DIAGNOSIS — H538 Other visual disturbances: Secondary | ICD-10-CM | POA: Diagnosis not present

## 2020-02-04 DIAGNOSIS — Z87891 Personal history of nicotine dependence: Secondary | ICD-10-CM | POA: Diagnosis not present

## 2020-02-04 DIAGNOSIS — Z885 Allergy status to narcotic agent status: Secondary | ICD-10-CM | POA: Diagnosis not present

## 2020-02-04 DIAGNOSIS — Z7901 Long term (current) use of anticoagulants: Secondary | ICD-10-CM | POA: Diagnosis not present

## 2020-02-04 DIAGNOSIS — E119 Type 2 diabetes mellitus without complications: Secondary | ICD-10-CM | POA: Diagnosis not present

## 2020-02-04 DIAGNOSIS — H539 Unspecified visual disturbance: Secondary | ICD-10-CM | POA: Diagnosis not present

## 2020-02-04 DIAGNOSIS — I509 Heart failure, unspecified: Secondary | ICD-10-CM | POA: Diagnosis not present
